# Patient Record
Sex: Female | Born: 1985 | Race: White | Hispanic: No | Marital: Married | Smoking: Never smoker
Health system: Southern US, Community
[De-identification: ages and names within clinical notes are randomized; demographics above are authoritative.]

## PROBLEM LIST (undated history)

## (undated) ENCOUNTER — Inpatient Hospital Stay (HOSPITAL_COMMUNITY): Payer: Self-pay

## (undated) DIAGNOSIS — F32A Depression, unspecified: Secondary | ICD-10-CM

## (undated) DIAGNOSIS — F329 Major depressive disorder, single episode, unspecified: Secondary | ICD-10-CM

## (undated) DIAGNOSIS — IMO0002 Reserved for concepts with insufficient information to code with codable children: Secondary | ICD-10-CM

## (undated) DIAGNOSIS — R87629 Unspecified abnormal cytological findings in specimens from vagina: Secondary | ICD-10-CM

## (undated) HISTORY — DX: Unspecified abnormal cytological findings in specimens from vagina: R87.629

## (undated) HISTORY — DX: Reserved for concepts with insufficient information to code with codable children: IMO0002

## (undated) HISTORY — DX: Depression, unspecified: F32.A

## (undated) HISTORY — PX: NO PAST SURGERIES: SHX2092

## (undated) HISTORY — DX: Major depressive disorder, single episode, unspecified: F32.9

---
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
## 2003-12-17 ENCOUNTER — Other Ambulatory Visit: Admission: RE | Admit: 2003-12-17 | Discharge: 2003-12-17 | Payer: Self-pay | Admitting: Obstetrics and Gynecology

## 2004-12-26 ENCOUNTER — Other Ambulatory Visit: Admission: RE | Admit: 2004-12-26 | Discharge: 2004-12-26 | Payer: Self-pay | Admitting: Obstetrics and Gynecology

## 2007-10-04 ENCOUNTER — Encounter: Admission: RE | Admit: 2007-10-04 | Discharge: 2007-10-04 | Payer: Self-pay | Admitting: Obstetrics and Gynecology

## 2012-04-22 LAB — OB RESULTS CONSOLE GC/CHLAMYDIA
Chlamydia: NEGATIVE
Gonorrhea: NEGATIVE

## 2012-04-22 LAB — OB RESULTS CONSOLE ANTIBODY SCREEN: Antibody Screen: NEGATIVE

## 2012-04-22 LAB — OB RESULTS CONSOLE GBS: GBS: POSITIVE

## 2012-04-22 LAB — OB RESULTS CONSOLE RPR: RPR: NONREACTIVE

## 2012-08-31 NOTE — L&D Delivery Note (Signed)
Delivery Note At 4:54 PM a viable and healthy female was delivered via Vaginal, Spontaneous Delivery (Presentation: Left Occiput Anterior).  APGAR: , ; weight .   Placenta status: spontaneous, intact .  Cord:  with the following complications: None.  Cord pH: na. Terminal meconium noted.  Anesthesia: Epidural  Episiotomy: None Lacerations: second degree Suture Repair: 2.0 vicryl rapide Est. Blood Loss (mL): 300  Mom to postpartum.  Baby to nursery-stable.  Fredric Slabach J 12/09/2012, 5:07 PM

## 2012-12-01 ENCOUNTER — Encounter (HOSPITAL_COMMUNITY): Payer: Self-pay | Admitting: *Deleted

## 2012-12-01 ENCOUNTER — Telehealth (HOSPITAL_COMMUNITY): Payer: Self-pay | Admitting: *Deleted

## 2012-12-01 NOTE — Telephone Encounter (Signed)
Preadmission screen  

## 2012-12-06 ENCOUNTER — Encounter (HOSPITAL_COMMUNITY): Payer: Self-pay | Admitting: *Deleted

## 2012-12-06 ENCOUNTER — Other Ambulatory Visit: Payer: Self-pay | Admitting: Obstetrics and Gynecology

## 2012-12-06 ENCOUNTER — Telehealth (HOSPITAL_COMMUNITY): Payer: Self-pay | Admitting: *Deleted

## 2012-12-06 NOTE — Telephone Encounter (Signed)
Preadmission screen  

## 2012-12-09 ENCOUNTER — Inpatient Hospital Stay (HOSPITAL_COMMUNITY)
Admission: AD | Admit: 2012-12-09 | Discharge: 2012-12-11 | DRG: 775 | Disposition: A | Discharge status: Home / Self Care | Payer: 59 | Source: Ambulatory Visit | Attending: Obstetrics and Gynecology | Admitting: Obstetrics and Gynecology

## 2012-12-09 ENCOUNTER — Inpatient Hospital Stay (HOSPITAL_COMMUNITY): Payer: 59 | Admitting: Anesthesiology

## 2012-12-09 ENCOUNTER — Encounter (HOSPITAL_COMMUNITY): Payer: Self-pay | Admitting: Anesthesiology

## 2012-12-09 DIAGNOSIS — D649 Anemia, unspecified: Secondary | ICD-10-CM | POA: Diagnosis not present

## 2012-12-09 DIAGNOSIS — O9903 Anemia complicating the puerperium: Secondary | ICD-10-CM | POA: Diagnosis not present

## 2012-12-09 LAB — CBC
MCH: 32.2 pg (ref 26.0–34.0)
MCHC: 34.9 g/dL (ref 30.0–36.0)
MCV: 92.4 fL (ref 78.0–100.0)
Platelets: 163 10*3/uL (ref 150–400)
RDW: 12.3 % (ref 11.5–15.5)

## 2012-12-09 LAB — RPR: RPR Ser Ql: NONREACTIVE

## 2012-12-09 MED ORDER — FLEET ENEMA 7-19 GM/118ML RE ENEM: 1.0000 | ENEMA | RECTAL | Status: DC | PRN

## 2012-12-09 MED ORDER — PRENATAL MULTIVITAMIN CH
1.0000 | ORAL_TABLET | Freq: Every day | ORAL | Status: DC
  Administered 2012-12-10: 1 via ORAL
  Filled 2012-12-09: qty 1

## 2012-12-09 MED ORDER — IBUPROFEN 600 MG PO TABS
600.0000 mg | ORAL_TABLET | Freq: Four times a day (QID) | ORAL | Status: DC
  Administered 2012-12-10 – 2012-12-11 (×6): 600 mg via ORAL
  Filled 2012-12-09 (×7): qty 1

## 2012-12-09 MED ORDER — OXYCODONE-ACETAMINOPHEN 5-325 MG PO TABS: 1.0000 | ORAL_TABLET | ORAL | Status: DC | PRN

## 2012-12-09 MED ORDER — PENICILLIN G POTASSIUM 5000000 UNITS IJ SOLR
5.0000 10*6.[IU] | Freq: Once | INTRAVENOUS | Status: AC
  Administered 2012-12-09: 5 10*6.[IU] via INTRAVENOUS
  Filled 2012-12-09: qty 5

## 2012-12-09 MED ORDER — ZOLPIDEM TARTRATE 5 MG PO TABS: 5.0000 mg | ORAL_TABLET | Freq: Every evening | ORAL | Status: DC | PRN

## 2012-12-09 MED ORDER — TETANUS-DIPHTH-ACELL PERTUSSIS 5-2.5-18.5 LF-MCG/0.5 IM SUSP
0.5000 mL | Freq: Once | INTRAMUSCULAR | Status: AC
  Administered 2012-12-10: 0.5 mL via INTRAMUSCULAR
  Filled 2012-12-09: qty 0.5

## 2012-12-09 MED ORDER — METHYLERGONOVINE MALEATE 0.2 MG PO TABS: 0.2000 mg | ORAL_TABLET | ORAL | Status: DC | PRN

## 2012-12-09 MED ORDER — PENICILLIN G POTASSIUM 5000000 UNITS IJ SOLR
2.5000 10*6.[IU] | INTRAVENOUS | Status: DC
  Administered 2012-12-09: 2.5 10*6.[IU] via INTRAVENOUS
  Filled 2012-12-09 (×4): qty 2.5

## 2012-12-09 MED ORDER — IBUPROFEN 600 MG PO TABS
600.0000 mg | ORAL_TABLET | Freq: Four times a day (QID) | ORAL | Status: DC | PRN
  Administered 2012-12-09: 600 mg via ORAL
  Filled 2012-12-09: qty 1

## 2012-12-09 MED ORDER — OXYTOCIN 40 UNITS IN LACTATED RINGERS INFUSION - SIMPLE MED
62.5000 mL/h | INTRAVENOUS | Status: DC
  Administered 2012-12-09: 62.5 mL/h via INTRAVENOUS
  Filled 2012-12-09: qty 1000

## 2012-12-09 MED ORDER — LIDOCAINE HCL (PF) 1 % IJ SOLN
30.0000 mL | INTRAMUSCULAR | Status: DC | PRN
  Filled 2012-12-09 (×2): qty 30

## 2012-12-09 MED ORDER — LACTATED RINGERS IV SOLN
500.0000 mL | Freq: Once | INTRAVENOUS | Status: AC
  Administered 2012-12-09: 500 mL via INTRAVENOUS

## 2012-12-09 MED ORDER — DIPHENHYDRAMINE HCL 50 MG/ML IJ SOLN: 12.5000 mg | INTRAMUSCULAR | Status: DC | PRN

## 2012-12-09 MED ORDER — LIDOCAINE HCL (PF) 1 % IJ SOLN
INTRAMUSCULAR | Status: DC | PRN
  Administered 2012-12-09 (×2): 4 mL

## 2012-12-09 MED ORDER — LANOLIN HYDROUS EX OINT: TOPICAL_OINTMENT | CUTANEOUS | Status: DC | PRN

## 2012-12-09 MED ORDER — FENTANYL 2.5 MCG/ML BUPIVACAINE 1/10 % EPIDURAL INFUSION (WH - ANES)
INTRAMUSCULAR | Status: DC | PRN
  Administered 2012-12-09: 14 mL/h via EPIDURAL

## 2012-12-09 MED ORDER — BENZOCAINE-MENTHOL 20-0.5 % EX AERO
1.0000 "application " | INHALATION_SPRAY | CUTANEOUS | Status: DC | PRN
  Administered 2012-12-09: 1 via TOPICAL
  Filled 2012-12-09: qty 56

## 2012-12-09 MED ORDER — DIPHENHYDRAMINE HCL 25 MG PO CAPS: 25.0000 mg | ORAL_CAPSULE | Freq: Four times a day (QID) | ORAL | Status: DC | PRN

## 2012-12-09 MED ORDER — EPHEDRINE 5 MG/ML INJ
10.0000 mg | INTRAVENOUS | Status: DC | PRN
  Filled 2012-12-09: qty 2

## 2012-12-09 MED ORDER — DIBUCAINE 1 % RE OINT: 1.0000 "application " | TOPICAL_OINTMENT | RECTAL | Status: DC | PRN

## 2012-12-09 MED ORDER — LACTATED RINGERS IV SOLN: 500.0000 mL | INTRAVENOUS | Status: DC | PRN

## 2012-12-09 MED ORDER — EPHEDRINE 5 MG/ML INJ
10.0000 mg | INTRAVENOUS | Status: DC | PRN
  Filled 2012-12-09: qty 2
  Filled 2012-12-09: qty 4

## 2012-12-09 MED ORDER — ONDANSETRON HCL 4 MG/2ML IJ SOLN: 4.0000 mg | Freq: Four times a day (QID) | INTRAMUSCULAR | Status: DC | PRN

## 2012-12-09 MED ORDER — PHENYLEPHRINE 40 MCG/ML (10ML) SYRINGE FOR IV PUSH (FOR BLOOD PRESSURE SUPPORT)
80.0000 ug | PREFILLED_SYRINGE | INTRAVENOUS | Status: DC | PRN
  Filled 2012-12-09: qty 2

## 2012-12-09 MED ORDER — FENTANYL 2.5 MCG/ML BUPIVACAINE 1/10 % EPIDURAL INFUSION (WH - ANES)
14.0000 mL/h | INTRAMUSCULAR | Status: DC | PRN
  Filled 2012-12-09: qty 125

## 2012-12-09 MED ORDER — PHENYLEPHRINE 40 MCG/ML (10ML) SYRINGE FOR IV PUSH (FOR BLOOD PRESSURE SUPPORT)
80.0000 ug | PREFILLED_SYRINGE | INTRAVENOUS | Status: DC | PRN
Start: 2012-12-09 — End: 2012-12-09
  Filled 2012-12-09: qty 5
  Filled 2012-12-09: qty 2

## 2012-12-09 MED ORDER — SENNOSIDES-DOCUSATE SODIUM 8.6-50 MG PO TABS
2.0000 | ORAL_TABLET | Freq: Every day | ORAL | Status: DC
  Administered 2012-12-09 – 2012-12-10 (×2): 2 via ORAL

## 2012-12-09 MED ORDER — SIMETHICONE 80 MG PO CHEW: 80.0000 mg | CHEWABLE_TABLET | ORAL | Status: DC | PRN

## 2012-12-09 MED ORDER — LACTATED RINGERS IV SOLN
INTRAVENOUS | Status: DC
  Administered 2012-12-09: 12:00:00 via INTRAVENOUS

## 2012-12-09 MED ORDER — ACETAMINOPHEN 325 MG PO TABS: 650.0000 mg | ORAL_TABLET | ORAL | Status: DC | PRN

## 2012-12-09 MED ORDER — FENTANYL 2.5 MCG/ML BUPIVACAINE 1/10 % EPIDURAL INFUSION (WH - ANES): 14.0000 mL/h | INTRAMUSCULAR | Status: DC | PRN

## 2012-12-09 MED ORDER — ONDANSETRON HCL 4 MG/2ML IJ SOLN: 4.0000 mg | INTRAMUSCULAR | Status: DC | PRN

## 2012-12-09 MED ORDER — TERBUTALINE SULFATE 1 MG/ML IJ SOLN: 0.2500 mg | Freq: Once | INTRAMUSCULAR | Status: DC | PRN

## 2012-12-09 MED ORDER — METHYLERGONOVINE MALEATE 0.2 MG/ML IJ SOLN: 0.2000 mg | INTRAMUSCULAR | Status: DC | PRN

## 2012-12-09 MED ORDER — OXYTOCIN BOLUS FROM INFUSION: 500.0000 mL | INTRAVENOUS | Status: DC

## 2012-12-09 MED ORDER — OXYTOCIN 40 UNITS IN LACTATED RINGERS INFUSION - SIMPLE MED
1.0000 m[IU]/min | INTRAVENOUS | Status: DC
  Administered 2012-12-09: 1 m[IU]/min via INTRAVENOUS

## 2012-12-09 MED ORDER — CITRIC ACID-SODIUM CITRATE 334-500 MG/5ML PO SOLN: 30.0000 mL | ORAL | Status: DC | PRN

## 2012-12-09 MED ORDER — WITCH HAZEL-GLYCERIN EX PADS: 1.0000 "application " | MEDICATED_PAD | CUTANEOUS | Status: DC | PRN

## 2012-12-09 MED ORDER — ONDANSETRON HCL 4 MG PO TABS: 4.0000 mg | ORAL_TABLET | ORAL | Status: DC | PRN

## 2012-12-09 NOTE — Progress Notes (Signed)
Delivery of live viable female by Dr Billy Coast, Melvyn Novas 9,9

## 2012-12-09 NOTE — Anesthesia Procedure Notes (Signed)
Epidural Patient location during procedure: OB Start time: 12/09/2012 12:41 PM  Staffing Anesthesiologist: Jahira Swiss A. Performed by: anesthesiologist   Preanesthetic Checklist Completed: patient identified, site marked, surgical consent, pre-op evaluation, timeout performed, IV checked, risks and benefits discussed and monitors and equipment checked  Epidural Patient position: sitting Prep: site prepped and draped and DuraPrep Patient monitoring: continuous pulse ox and blood pressure Approach: midline Injection technique: LOR air  Needle:  Needle type: Tuohy  Needle gauge: 17 G Needle length: 9 cm and 9 Needle insertion depth: 5 cm cm Catheter type: closed end flexible Catheter size: 19 Gauge Catheter at skin depth: 10 cm Test dose: negative and Other  Assessment Events: blood not aspirated, injection not painful, no injection resistance, negative IV test and no paresthesia  Additional Notes Patient identified. Risks and benefits discussed including failed block, incomplete  Pain control, post dural puncture headache, nerve damage, paralysis, blood pressure Changes, nausea, vomiting, reactions to medications-both toxic and allergic and post Partum back pain. All questions were answered. Patient expressed understanding and wished to proceed. Sterile technique was used throughout procedure. Epidural site was Dressed with sterile barrier dressing. No paresthesias, signs of intravascular injection Or signs of intrathecal spread were encountered.  Patient was more comfortable after the epidural was dosed. Please see RN's note for documentation of vital signs and FHR which are stable.

## 2012-12-09 NOTE — Anesthesia Preprocedure Evaluation (Signed)

## 2012-12-09 NOTE — Progress Notes (Signed)
[name] is a 27 y.o. G1P0 at [redacted]w[redacted]d by LMP admitted for rupture of membranes  Subjective: comfortable  Objective: LMP 02/26/2012      FHT:  FHR: 155 bpm, variability: moderate,  accelerations:  Present,  decelerations:  Absent UC:   irregular, every 53/90/0 minutes SVE:    3/90/0 Meconium srom  Labs: No results found for this basename: WBC, HGB, HCT, MCV, PLT    Assessment / Plan: Meconium SROM  Labor: Progressing normally Preeclampsia:  na Fetal Wellbeing:  Category I Pain Control:  Labor support without medications I/D:  n/a Anticipated MOD:  NSVD  [name] 12/09/2012, 11:19 AM

## 2012-12-10 ENCOUNTER — Inpatient Hospital Stay (HOSPITAL_COMMUNITY): Admission: RE | Admit: 2012-12-10 | Payer: 59 | Source: Ambulatory Visit

## 2012-12-10 LAB — CBC
Hemoglobin: 9.6 g/dL — ABNORMAL LOW (ref 12.0–15.0)
Platelets: 125 10*3/uL — ABNORMAL LOW (ref 150–400)
RBC: 2.98 MIL/uL — ABNORMAL LOW (ref 3.87–5.11)
WBC: 10.2 10*3/uL (ref 4.0–10.5)

## 2012-12-10 MED ORDER — FERROUS SULFATE 325 (65 FE) MG PO TABS
325.0000 mg | ORAL_TABLET | Freq: Every day | ORAL | Status: DC
  Administered 2012-12-10 – 2012-12-11 (×2): 325 mg via ORAL
  Filled 2012-12-10 (×2): qty 1

## 2012-12-10 NOTE — Anesthesia Postprocedure Evaluation (Signed)
  Anesthesia Post-op Note  Patient: [name]  Procedure(s) Performed: * No procedures listed *  Patient Location: PACU and Mother/Baby  Anesthesia Type:Epidural  Level of Consciousness: awake, alert  and oriented  Airway and Oxygen Therapy: Patient Spontanous Breathing  Post-op Pain: none  Post-op Assessment: Post-op Vital signs reviewed, Patient's Cardiovascular Status Stable, No headache, No backache, No residual numbness and No residual motor weakness  Post-op Vital Signs: Reviewed and stable  Complications: No apparent anesthesia complications

## 2012-12-10 NOTE — H&P (Signed)
[name], [name]             ACCOUNT NO.:  [id number]  MEDICAL RECORD NO.:  [id number]  LOCATION:  9125                          FACILITY:  [facility]  PHYSICIAN:  Lenoard Aden, M.D.DATE OF BIRTH:  [birth year]  DATE OF ADMISSION:  12/09/2012 DATE OF DISCHARGE:                             HISTORY & PHYSICAL   CHIEF COMPLAINT:  Spontaneous rupture of membranes with meconium at 41 weeks at group B Strep positive.  She is a 27 year old white female, G1, P0, at [redacted] weeks gestation with spontaneous rupture of membranes, meconium-stained fluid with GBS positive, favorable cervix for admission augmentation/induction.  She has no known drug allergies.  MEDICATIONS:  Prenatal vitamins.  SOCIAL HISTORY:  She is a nonsmoker, nondrinker.  She denies domestic or physical violence.  FAMILY HISTORY:  Spina bifida, hypertension, multiple sclerosis.  SURGICAL HISTORY:  Noncontributory.  MEDICAL HISTORY:  Otherwise noncontributory.  PHYSICAL EXAMINATION:  GENERAL:  She is well-developed well-nourished white female, in no acute distress. HEENT:  Normal. NECK:  Supple.  Full range of motion. LUNGS:  Clear. HEART:  Regular rhythm. ABDOMEN:  Soft, gravid, nontender.  Estimated fetal weight 7-7 pounds. Cervix is 3 cm, 90% vertex, 0 station. EXTREMITIES:  There are no cords. NEUROLOGIC:  Nonfocal. SKIN:  Intact.  IMPRESSION: 1. A 41-week intrauterine pregnancy. 2. GBS positive. 3. Meconium rupture of membranes.  PLAN:  Admit Pitocin and epidural as needed.  Anticipate attempts at vaginal delivery.     Lenoard Aden, M.D.     RJT/MEDQ  D:  12/09/2012  T:  12/10/2012  Job:  [phone]

## 2012-12-10 NOTE — Progress Notes (Signed)
Post Partum Day 1, s/p SVD, female. Breast feeding.  Subjective: no complaints, up ad lib, voiding and tolerating PO Had some postpartum bleeding last night but improved this morning.   Objective: Blood pressure 109/63, pulse 77, temperature 97.5 F (36.4 C), temperature source Oral, resp. rate 18, height 5\' 4"  (1.626 m), weight 160 lb (72.576 kg), last menstrual period 02/26/2012, unknown if currently breastfeeding.  Physical Exam:  General: alert and cooperative Lochia: appropriate Uterine Fundus: firm Incision: Perineal laceration, no complaints DVT Evaluation: No evidence of DVT seen on physical exam.   Recent Labs  12/09/12 1130 12/10/12 0720  HGB 12.7 9.6*  HCT 36.4 27.9*  Drop in H/H  Rub Imm TDaP not done in 3rd trim, plan to give before discharge, pt counseled, agrees.   Assessment/Plan: Plan for discharge tomorrow, Breastfeeding and Lactation consult Anemia- needs Iron daily and continue prenatal vitamin Baby is doing well.    LOS: 1 day   [name] R 12/10/2012, 9:49 PM

## 2012-12-11 ENCOUNTER — Inpatient Hospital Stay (HOSPITAL_COMMUNITY): Admission: RE | Admit: 2012-12-11 | Payer: 59 | Source: Ambulatory Visit

## 2012-12-11 MED ORDER — IBUPROFEN 600 MG PO TABS: 600.0000 mg | ORAL_TABLET | Freq: Four times a day (QID) | ORAL | Status: DC

## 2012-12-11 MED ORDER — FERROUS SULFATE 325 (65 FE) MG PO TABS: 325.0000 mg | ORAL_TABLET | Freq: Every day | ORAL | Status: DC

## 2012-12-11 NOTE — Discharge Summary (Signed)
Obstetric Discharge Summary Reason for Admission: onset of labor Prenatal Procedures: none Intrapartum Procedures: spontaneous vaginal delivery Postpartum Procedures: none Complications-Operative and Postpartum: 2nd degree perineal laceration  Hemoglobin  Date Value Range Status  12/10/2012 9.6* 12.0 - 15.0 g/dL Final     REPEATED TO VERIFY     DELTA CHECK NOTED     HCT  Date Value Range Status  12/10/2012 27.9* 36.0 - 46.0 % Final    Discharge Diagnoses: Term Pregnancy-delivered  Discharge Information: Date: 12/11/2012 Activity: pelvic rest Diet: routine Medications: Ibuprofen Condition: stable Instructions: refer to practice specific booklet Discharge to: home   Newborn Data: Live born female  Birth Weight: 7 lb (3175 g) APGAR: 9, 9  Home with mother.  [name] 12/11/2012, 10:41 AM

## 2012-12-11 NOTE — Clinical Social Work Note (Signed)
CSW spoke with [name].  [name] reports hx from early 20s, no current symptoms concerns.    Patient was referred for history of depression/anxiety.  * Referral screened out by Clinical Social Worker because none of the following criteria appear to apply: ~ History of anxiety/depression during this pregnancy, or of post-partum depression. ~ Diagnosis of anxiety and/or depression within last 3 years ~ History of depression due to pregnancy loss/loss of child  OR  * Patient's symptoms currently being treated with medication and/or therapy.  Please contact the Clinical Social Worker if needs arise, or by the patient's request.  [phone]

## 2012-12-11 NOTE — Progress Notes (Signed)
Post Partum Day#2,  s/p SVD, female. Breast feeding.  Subjective: no complaints, up ad lib, voiding and tolerating PO Had some postpartum bleeding last night but improved this morning.   Objective: BP 104/67  Pulse 67  Temp(Src) 98.5 F (36.9 C) (Oral)  Resp 18  Ht 5\' 4"  (1.626 m)  Wt 160 lb (72.576 kg)  BMI 27.45 kg/m2  LMP 02/26/2012  Physical Exam:  General: alert and cooperative Lochia: appropriate Uterine Fundus: firm Incision: Perineal laceration, no complaints DVT Evaluation: No evidence of DVT seen on physical exam.   Recent Labs  12/09/12 1130 12/10/12 0720  HGB 12.7 9.6*  HCT 36.4 27.9*  Drop in H/H , started Iron Rub Imm TDaP not done in 3rd trim, plan to give before discharge, pt counseled, agrees.   Assessment/Plan: Discharge home. Stable, Breast feeding. Baby girl doing well.  Anemia- needs Iron daily and continue prenatal vitamin F/up Dr Billy Coast 6 wks.  PPcare reviewed. Booklet from office provided.   [name] 12/11/2012, 10:37 AM

## 2014-07-02 ENCOUNTER — Encounter (HOSPITAL_COMMUNITY): Payer: Self-pay | Admitting: Anesthesiology

## 2018-06-05 ENCOUNTER — Ambulatory Visit (HOSPITAL_COMMUNITY)
Admission: AD | Admit: 2018-06-05 | Discharge: 2018-06-05 | Disposition: A | Discharge status: Home / Self Care | Payer: BLUE CROSS/BLUE SHIELD | Source: Ambulatory Visit | Attending: Obstetrics and Gynecology | Admitting: Obstetrics and Gynecology

## 2018-06-05 ENCOUNTER — Inpatient Hospital Stay (HOSPITAL_COMMUNITY): Payer: BLUE CROSS/BLUE SHIELD

## 2018-06-05 ENCOUNTER — Other Ambulatory Visit: Payer: Self-pay

## 2018-06-05 ENCOUNTER — Encounter (HOSPITAL_COMMUNITY)
Admission: AD | Disposition: A | Discharge status: Home / Self Care | Payer: Self-pay | Source: Ambulatory Visit | Attending: Obstetrics

## 2018-06-05 ENCOUNTER — Encounter (HOSPITAL_COMMUNITY): Payer: Self-pay

## 2018-06-05 ENCOUNTER — Inpatient Hospital Stay (HOSPITAL_COMMUNITY): Payer: BLUE CROSS/BLUE SHIELD | Admitting: Anesthesiology

## 2018-06-05 DIAGNOSIS — O00101 Right tubal pregnancy without intrauterine pregnancy: Secondary | ICD-10-CM | POA: Insufficient documentation

## 2018-06-05 DIAGNOSIS — O009 Unspecified ectopic pregnancy without intrauterine pregnancy: Secondary | ICD-10-CM | POA: Diagnosis present

## 2018-06-05 DIAGNOSIS — O209 Hemorrhage in early pregnancy, unspecified: Secondary | ICD-10-CM

## 2018-06-05 DIAGNOSIS — K661 Hemoperitoneum: Secondary | ICD-10-CM | POA: Insufficient documentation

## 2018-06-05 HISTORY — PX: LAPAROSCOPY: SHX197

## 2018-06-05 HISTORY — PX: LAPAROSCOPIC UNILATERAL SALPINGECTOMY: SHX5934

## 2018-06-05 LAB — DIC (DISSEMINATED INTRAVASCULAR COAGULATION) PANEL
APTT: 23 s — AB (ref 24–36)
D DIMER QUANT: 0.49 ug{FEU}/mL (ref 0.00–0.50)
INR: 1.1
PROTHROMBIN TIME: 14.1 s (ref 11.4–15.2)

## 2018-06-05 LAB — URINALYSIS, ROUTINE W REFLEX MICROSCOPIC
BILIRUBIN URINE: NEGATIVE
GLUCOSE, UA: NEGATIVE mg/dL
Ketones, ur: 5 mg/dL — AB
LEUKOCYTES UA: NEGATIVE
NITRITE: NEGATIVE
PH: 5 (ref 5.0–8.0)
Protein, ur: 30 mg/dL — AB
RBC / HPF: >50 RBC/hpf — ABNORMAL HIGH (ref 0–5)
Specific Gravity, Urine: 1.024 (ref 1.005–1.030)

## 2018-06-05 LAB — CBC
HCT: 35.1 % — ABNORMAL LOW (ref 36.0–46.0)
HEMATOCRIT: 31.5 % — AB (ref 36.0–46.0)
HEMATOCRIT: 26.5 % — AB (ref 36.0–46.0)
HEMOGLOBIN: 12.1 g/dL (ref 12.0–15.0)
Hemoglobin: 10.7 g/dL — ABNORMAL LOW (ref 12.0–15.0)
Hemoglobin: 9.5 g/dL — ABNORMAL LOW (ref 12.0–15.0)
MCH: 31 pg (ref 26.0–34.0)
MCH: 30.6 pg (ref 26.0–34.0)
MCH: 32.3 pg (ref 26.0–34.0)
MCHC: 34.5 g/dL (ref 30.0–36.0)
MCHC: 34 g/dL (ref 30.0–36.0)
MCHC: 35.8 g/dL (ref 30.0–36.0)
MCV: 90 fL (ref 78.0–100.0)
MCV: 90 fL (ref 78.0–100.0)
MCV: 90.1 fL (ref 78.0–100.0)
PLATELETS: 176 10*3/uL (ref 150–400)
Platelets: 178 10*3/uL (ref 150–400)
Platelets: 149 10*3/uL — ABNORMAL LOW (ref 150–400)
RBC: 3.9 MIL/uL (ref 3.87–5.11)
RBC: 3.5 MIL/uL — ABNORMAL LOW (ref 3.87–5.11)
RBC: 2.94 MIL/uL — AB (ref 3.87–5.11)
RDW: 12.2 % (ref 11.5–15.5)
RDW: 12.2 % (ref 11.5–15.5)
RDW: 12.2 % (ref 11.5–15.5)
WBC: 9.2 10*3/uL (ref 4.0–10.5)
WBC: 11.7 10*3/uL — ABNORMAL HIGH (ref 4.0–10.5)
WBC: 8.9 10*3/uL (ref 4.0–10.5)

## 2018-06-05 LAB — PREPARE RBC (CROSSMATCH)

## 2018-06-05 LAB — POCT PREGNANCY, URINE: PREG TEST UR: POSITIVE — AB

## 2018-06-05 LAB — WET PREP, GENITAL
Clue Cells Wet Prep HPF POC: NONE SEEN
SPERM: NONE SEEN
Trich, Wet Prep: NONE SEEN
Yeast Wet Prep HPF POC: NONE SEEN

## 2018-06-05 LAB — DIC (DISSEMINATED INTRAVASCULAR COAGULATION)PANEL
Fibrinogen: 260 mg/dL (ref 210–475)
Platelets: 184 10*3/uL (ref 150–400)
Smear Review: NONE SEEN

## 2018-06-05 LAB — HCG, QUANTITATIVE, PREGNANCY: HCG, BETA CHAIN, QUANT, S: 5650 m[IU]/mL — AB (ref <5)

## 2018-06-05 LAB — ABO/RH: ABO/RH(D): A POS

## 2018-06-05 SURGERY — LAPAROSCOPY OPERATIVE
Anesthesia: General | Site: Abdomen | Laterality: Right

## 2018-06-05 MED ORDER — HYDROMORPHONE HCL 1 MG/ML IJ SOLN
INTRAMUSCULAR | Status: AC
  Filled 2018-06-05: qty 0.5

## 2018-06-05 MED ORDER — FENTANYL CITRATE (PF) 100 MCG/2ML IJ SOLN
INTRAMUSCULAR | Status: DC | PRN
  Administered 2018-06-05 (×2): 50 ug via INTRAVENOUS
  Administered 2018-06-05: 100 ug via INTRAVENOUS

## 2018-06-05 MED ORDER — SODIUM CHLORIDE 0.9 % IV SOLN
INTRAVENOUS | Status: DC
  Administered 2018-06-05: 17:00:00 via INTRAVENOUS

## 2018-06-05 MED ORDER — FENTANYL CITRATE (PF) 250 MCG/5ML IJ SOLN
INTRAMUSCULAR | Status: AC
  Filled 2018-06-05: qty 5

## 2018-06-05 MED ORDER — SODIUM CHLORIDE 0.45 % IV SOLN
INTRAVENOUS | Status: DC | PRN
  Administered 2018-06-05 (×2): via INTRAVENOUS

## 2018-06-05 MED ORDER — ROCURONIUM BROMIDE 100 MG/10ML IV SOLN
INTRAVENOUS | Status: AC
  Filled 2018-06-05: qty 1

## 2018-06-05 MED ORDER — SODIUM CHLORIDE 0.9 % IJ SOLN
INTRAMUSCULAR | Status: AC
  Filled 2018-06-05: qty 20

## 2018-06-05 MED ORDER — ONDANSETRON HCL 4 MG/2ML IJ SOLN
INTRAMUSCULAR | Status: AC
  Filled 2018-06-05: qty 2

## 2018-06-05 MED ORDER — MIDAZOLAM HCL 2 MG/2ML IJ SOLN
INTRAMUSCULAR | Status: DC | PRN
  Administered 2018-06-05: 1 mg via INTRAVENOUS

## 2018-06-05 MED ORDER — LIDOCAINE HCL (CARDIAC) PF 100 MG/5ML IV SOSY
PREFILLED_SYRINGE | INTRAVENOUS | Status: DC | PRN
  Administered 2018-06-05: 100 mg via INTRAVENOUS

## 2018-06-05 MED ORDER — SODIUM CHLORIDE 0.9 % IV SOLN
510.0000 mg | Freq: Once | INTRAVENOUS | Status: AC
  Administered 2018-06-05: 510 mg via INTRAVENOUS
  Filled 2018-06-05: qty 17

## 2018-06-05 MED ORDER — ONDANSETRON HCL 4 MG PO TABS: 4.0000 mg | ORAL_TABLET | Freq: Four times a day (QID) | ORAL | Status: DC | PRN

## 2018-06-05 MED ORDER — LACTATED RINGERS IV SOLN
INTRAVENOUS | Status: DC | PRN
  Administered 2018-06-05: 15:00:00 via INTRAVENOUS

## 2018-06-05 MED ORDER — FAMOTIDINE IN NACL 20-0.9 MG/50ML-% IV SOLN
20.0000 mg | Freq: Once | INTRAVENOUS | Status: AC
  Administered 2018-06-05: 20 mg via INTRAVENOUS
  Filled 2018-06-05: qty 50

## 2018-06-05 MED ORDER — PANTOPRAZOLE SODIUM 40 MG PO TBEC
40.0000 mg | DELAYED_RELEASE_TABLET | Freq: Every day | ORAL | Status: DC
  Administered 2018-06-05: 40 mg via ORAL
  Filled 2018-06-05: qty 1

## 2018-06-05 MED ORDER — IBUPROFEN 600 MG PO TABS: 600.0000 mg | ORAL_TABLET | Freq: Four times a day (QID) | ORAL | 0 refills | Status: DC | PRN

## 2018-06-05 MED ORDER — BUPIVACAINE HCL (PF) 0.25 % IJ SOLN
INTRAMUSCULAR | Status: DC | PRN
  Administered 2018-06-05: 9 mL

## 2018-06-05 MED ORDER — SODIUM CHLORIDE 0.9% IV SOLUTION: Freq: Once | INTRAVENOUS | Status: DC

## 2018-06-05 MED ORDER — HYDROMORPHONE HCL 1 MG/ML IJ SOLN: 1.0000 mg | INTRAMUSCULAR | Status: DC | PRN

## 2018-06-05 MED ORDER — SUGAMMADEX SODIUM 200 MG/2ML IV SOLN
INTRAVENOUS | Status: DC | PRN
  Administered 2018-06-05: 200 mg via INTRAVENOUS

## 2018-06-05 MED ORDER — KETOROLAC TROMETHAMINE 30 MG/ML IJ SOLN
INTRAMUSCULAR | Status: AC
  Filled 2018-06-05: qty 1

## 2018-06-05 MED ORDER — SCOPOLAMINE 1 MG/3DAYS TD PT72
MEDICATED_PATCH | TRANSDERMAL | Status: DC | PRN
  Administered 2018-06-05: 1 via TRANSDERMAL

## 2018-06-05 MED ORDER — ONDANSETRON HCL 4 MG/2ML IJ SOLN: 4.0000 mg | Freq: Four times a day (QID) | INTRAMUSCULAR | Status: DC | PRN

## 2018-06-05 MED ORDER — ACETAMINOPHEN 10 MG/ML IV SOLN: 1000.0000 mg | Freq: Once | INTRAVENOUS | Status: DC | PRN

## 2018-06-05 MED ORDER — PROPOFOL 10 MG/ML IV BOLUS
INTRAVENOUS | Status: DC | PRN
  Administered 2018-06-05: 50 mg via INTRAVENOUS
  Administered 2018-06-05: 150 mg via INTRAVENOUS

## 2018-06-05 MED ORDER — SODIUM CHLORIDE 0.9 % IR SOLN
Status: DC | PRN
  Administered 2018-06-05: 3000 mL

## 2018-06-05 MED ORDER — KETOROLAC TROMETHAMINE 30 MG/ML IJ SOLN
INTRAMUSCULAR | Status: DC | PRN
  Administered 2018-06-05: 30 mg via INTRAVENOUS

## 2018-06-05 MED ORDER — OXYCODONE-ACETAMINOPHEN 5-325 MG PO TABS: 1.0000 | ORAL_TABLET | Freq: Four times a day (QID) | ORAL | 0 refills | Status: AC | PRN

## 2018-06-05 MED ORDER — MIDAZOLAM HCL 2 MG/2ML IJ SOLN
INTRAMUSCULAR | Status: AC
  Filled 2018-06-05: qty 2

## 2018-06-05 MED ORDER — PROPOFOL 10 MG/ML IV BOLUS
INTRAVENOUS | Status: AC
  Filled 2018-06-05: qty 20

## 2018-06-05 MED ORDER — HYDROCODONE-ACETAMINOPHEN 7.5-325 MG PO TABS
ORAL_TABLET | ORAL | Status: AC
  Filled 2018-06-05: qty 1

## 2018-06-05 MED ORDER — SIMETHICONE 80 MG PO CHEW: 80.0000 mg | CHEWABLE_TABLET | Freq: Four times a day (QID) | ORAL | Status: DC | PRN

## 2018-06-05 MED ORDER — MEPERIDINE HCL 25 MG/ML IJ SOLN: 6.2500 mg | INTRAMUSCULAR | Status: DC | PRN

## 2018-06-05 MED ORDER — IBUPROFEN 600 MG PO TABS
600.0000 mg | ORAL_TABLET | Freq: Four times a day (QID) | ORAL | Status: DC
  Administered 2018-06-05: 600 mg via ORAL
  Filled 2018-06-05: qty 1

## 2018-06-05 MED ORDER — DEXAMETHASONE SODIUM PHOSPHATE 10 MG/ML IJ SOLN
INTRAMUSCULAR | Status: DC | PRN
  Administered 2018-06-05: 10 mg via INTRAVENOUS

## 2018-06-05 MED ORDER — IBUPROFEN 600 MG PO TABS: 600.0000 mg | ORAL_TABLET | Freq: Four times a day (QID) | ORAL | Status: DC | PRN

## 2018-06-05 MED ORDER — LIDOCAINE HCL (CARDIAC) PF 100 MG/5ML IV SOSY
PREFILLED_SYRINGE | INTRAVENOUS | Status: AC
  Filled 2018-06-05: qty 5

## 2018-06-05 MED ORDER — SILVER NITRATE-POT NITRATE 75-25 % EX MISC
CUTANEOUS | Status: AC
  Filled 2018-06-05: qty 1

## 2018-06-05 MED ORDER — BUPIVACAINE HCL (PF) 0.25 % IJ SOLN
INTRAMUSCULAR | Status: AC
  Filled 2018-06-05: qty 30

## 2018-06-05 MED ORDER — PHENYLEPHRINE 40 MCG/ML (10ML) SYRINGE FOR IV PUSH (FOR BLOOD PRESSURE SUPPORT)
PREFILLED_SYRINGE | INTRAVENOUS | Status: AC
  Filled 2018-06-05: qty 10

## 2018-06-05 MED ORDER — SCOPOLAMINE 1 MG/3DAYS TD PT72
MEDICATED_PATCH | TRANSDERMAL | Status: AC
  Filled 2018-06-05: qty 1

## 2018-06-05 MED ORDER — PHENYLEPHRINE HCL 10 MG/ML IJ SOLN
INTRAMUSCULAR | Status: DC | PRN
  Administered 2018-06-05 (×3): 80 mg via INTRAVENOUS

## 2018-06-05 MED ORDER — ONDANSETRON HCL 4 MG/2ML IJ SOLN
INTRAMUSCULAR | Status: DC | PRN
  Administered 2018-06-05: 4 mg via INTRAVENOUS

## 2018-06-05 MED ORDER — MENTHOL 3 MG MT LOZG: 1.0000 | LOZENGE | OROMUCOSAL | Status: DC | PRN

## 2018-06-05 MED ORDER — OXYCODONE-ACETAMINOPHEN 5-325 MG PO TABS: 1.0000 | ORAL_TABLET | Freq: Four times a day (QID) | ORAL | 0 refills | Status: DC | PRN

## 2018-06-05 MED ORDER — SERTRALINE HCL 50 MG PO TABS
50.0000 mg | ORAL_TABLET | Freq: Every day | ORAL | Status: DC
  Administered 2018-06-05: 50 mg via ORAL
  Filled 2018-06-05 (×2): qty 1

## 2018-06-05 MED ORDER — PROMETHAZINE HCL 25 MG/ML IJ SOLN: 6.2500 mg | INTRAMUSCULAR | Status: DC | PRN

## 2018-06-05 MED ORDER — ONDANSETRON HCL 4 MG PO TABS: 4.0000 mg | ORAL_TABLET | Freq: Four times a day (QID) | ORAL | 0 refills | Status: DC | PRN

## 2018-06-05 MED ORDER — ROCURONIUM BROMIDE 100 MG/10ML IV SOLN
INTRAVENOUS | Status: DC | PRN
  Administered 2018-06-05: 35 mg via INTRAVENOUS
  Administered 2018-06-05: 5 mg via INTRAVENOUS

## 2018-06-05 MED ORDER — SUCCINYLCHOLINE CHLORIDE 20 MG/ML IJ SOLN
INTRAMUSCULAR | Status: DC | PRN
  Administered 2018-06-05: 120 mg via INTRAVENOUS

## 2018-06-05 MED ORDER — HYDROCODONE-ACETAMINOPHEN 7.5-325 MG PO TABS
1.0000 | ORAL_TABLET | Freq: Once | ORAL | Status: AC | PRN
  Administered 2018-06-05: 1 via ORAL

## 2018-06-05 MED ORDER — OXYCODONE-ACETAMINOPHEN 5-325 MG PO TABS
1.0000 | ORAL_TABLET | ORAL | Status: DC | PRN
  Administered 2018-06-05: 1 via ORAL
  Filled 2018-06-05: qty 1

## 2018-06-05 MED ORDER — SODIUM CHLORIDE 0.9 % IJ SOLN
INTRAMUSCULAR | Status: DC | PRN
  Administered 2018-06-05: 10 mL

## 2018-06-05 MED ORDER — HYDROMORPHONE HCL 1 MG/ML IJ SOLN
0.2500 mg | INTRAMUSCULAR | Status: DC | PRN
  Administered 2018-06-05 (×2): 0.25 mg via INTRAVENOUS

## 2018-06-05 MED ORDER — DEXAMETHASONE SODIUM PHOSPHATE 4 MG/ML IJ SOLN
INTRAMUSCULAR | Status: AC
  Filled 2018-06-05: qty 1

## 2018-06-05 SURGICAL SUPPLY — 35 items
ADH SKN CLS APL DERMABOND .7 (GAUZE/BANDAGES/DRESSINGS) ×2
BAG SPEC RTRVL LRG 6X4 10 (ENDOMECHANICALS) ×2
CABLE HIGH FREQUENCY MONO STRZ (ELECTRODE) IMPLANT
DECANTER SPIKE VIAL GLASS SM (MISCELLANEOUS) ×3 IMPLANT
DERMABOND ADVANCED (GAUZE/BANDAGES/DRESSINGS) ×1
DERMABOND ADVANCED .7 DNX12 (GAUZE/BANDAGES/DRESSINGS) IMPLANT
DRSG OPSITE POSTOP 3X4 (GAUZE/BANDAGES/DRESSINGS) ×3 IMPLANT
DURAPREP 26ML APPLICATOR (WOUND CARE) ×3 IMPLANT
ELECT REM PT RETURN 9FT ADLT (ELECTROSURGICAL) ×3
ELECTRODE REM PT RTRN 9FT ADLT (ELECTROSURGICAL) ×2 IMPLANT
FORCEPS CUTTING 33CM 5MM (CUTTING FORCEPS) ×1 IMPLANT
GLOVE BIO SURGEON STRL SZ 6.5 (GLOVE) ×3 IMPLANT
GLOVE BIOGEL PI IND STRL 7.0 (GLOVE) ×8 IMPLANT
GLOVE BIOGEL PI INDICATOR 7.0 (GLOVE) ×4
GOWN STRL REUS W/TWL LRG LVL3 (GOWN DISPOSABLE) ×6 IMPLANT
MANIPULATOR UTERINE 4.5 ZUMI (MISCELLANEOUS) ×3 IMPLANT
PACK LAPAROSCOPY BASIN (CUSTOM PROCEDURE TRAY) ×3 IMPLANT
PACK TRENDGUARD 450 HYBRID PRO (MISCELLANEOUS) IMPLANT
PENCIL BUTTON HOLSTER BLD 10FT (ELECTRODE) ×1 IMPLANT
POUCH SPECIMEN RETRIEVAL 10MM (ENDOMECHANICALS) ×1 IMPLANT
PROTECTOR NERVE ULNAR (MISCELLANEOUS) ×6 IMPLANT
SCISSORS LAP 5X35 DISP (ENDOMECHANICALS) IMPLANT
SET IRRIG TUBING LAPAROSCOPIC (IRRIGATION / IRRIGATOR) ×1 IMPLANT
SLEEVE XCEL OPT CAN 5 100 (ENDOMECHANICALS) ×3 IMPLANT
SOLUTION ELECTROLUBE (MISCELLANEOUS) ×1 IMPLANT
SUT VICRYL 0 UR6 27IN ABS (SUTURE) ×3 IMPLANT
SUT VICRYL 4-0 PS2 18IN ABS (SUTURE) ×3 IMPLANT
TOWEL OR 17X24 6PK STRL BLUE (TOWEL DISPOSABLE) ×6 IMPLANT
TRAY FOLEY W/BAG SLVR 14FR (SET/KITS/TRAYS/PACK) ×3 IMPLANT
TRENDGUARD 450 HYBRID PRO PACK (MISCELLANEOUS) ×3
TROCAR BALLN 12MMX100 BLUNT (TROCAR) ×3 IMPLANT
TROCAR XCEL NON-BLD 11X100MML (ENDOMECHANICALS) ×1 IMPLANT
TROCAR XCEL NON-BLD 5MMX100MML (ENDOMECHANICALS) ×3 IMPLANT
TUBING INSUF HEATED (TUBING) ×3 IMPLANT
WARMER LAPAROSCOPE (MISCELLANEOUS) ×3 IMPLANT

## 2018-06-05 NOTE — Progress Notes (Addendum)
Pt verbalized understanding and copy of instructions given to pt.   Pt d/c home escorted out by NT in wc with husband. Pt stable. Pt d/c home at 2130

## 2018-06-05 NOTE — Brief Op Note (Signed)
06/05/2018  3:35 PM  PATIENT:  [name]  32 y.o. female  PRE-OPERATIVE DIAGNOSIS:  ruptured ectopic  POST-OPERATIVE DIAGNOSIS:  ruptured ectopic  PROCEDURE:  Procedure(s): LAPAROSCOPY OPERATIVE WITH REMOVAL OF ECTOPIC PREGNANCY (N/A) LAPAROSCOPIC UNILATERAL SALPINGECTOMY (Right)  Evacuation of hemoperitoneum, removal of ectopic pregnancy  SURGEON:  Surgeon(s) and Role:    Noland Fordyce, MD - Primary  PHYSICIAN ASSISTANT:   ASSISTANTS: Renae Fickle, CNM   ANESTHESIA:   local and general  EBL:  675 mL   BLOOD ADMINISTERED:none  DRAINS: Urinary Catheter (Foley)   LOCAL MEDICATIONS USED:  MARCAINE    and Amount: 10 ml  SPECIMEN:  Source of Specimen:  right fallopian tube with ectopic pregnancy  DISPOSITION OF SPECIMEN:  PATHOLOGY  COUNTS:  YES  TOURNIQUET:  * No tourniquets in log *  DICTATION: .Note written in EPIC  PLAN OF CARE: extended observation  PATIENT DISPOSITION:  PACU - hemodynamically stable.   Delay start of Pharmacological VTE agent (>24hrs) due to surgical blood loss or risk of bleeding: yes

## 2018-06-05 NOTE — Anesthesia Preprocedure Evaluation (Signed)
Anesthesia Evaluation  Patient identified by MRN, date of birth, ID band Patient awake    Reviewed: Allergy & Precautions, H&P , Patient's Chart, lab work & pertinent test results  Airway Mallampati: II  TM Distance: >3 FB Neck ROM: Full    Dental no notable dental hx. (+) Teeth Intact, Dental Advisory Given   Pulmonary neg pulmonary ROS,    Pulmonary exam normal breath sounds clear to auscultation       Cardiovascular Exercise Tolerance: Good negative cardio ROS Normal cardiovascular exam Rhythm:Regular Rate:Normal     Neuro/Psych PSYCHIATRIC DISORDERS negative neurological ROS     GI/Hepatic negative GI ROS, Neg liver ROS,   Endo/Other    Renal/GU      Musculoskeletal   Abdominal   Peds  Hematology   Anesthesia Other Findings   Reproductive/Obstetrics (+) Pregnancy                             Lab Results  Component Value Date   WBC 9.2 06/05/2018   HGB 12.1 06/05/2018   HCT 35.1 (L) 06/05/2018   MCV 90.0 06/05/2018   PLT 178 06/05/2018    Anesthesia Physical Anesthesia Plan  ASA: II and emergent  Anesthesia Plan: General   Post-op Pain Management:    Induction: Rapid sequence, Intravenous and Cricoid pressure planned  PONV Risk Score and Plan: 4 or greater and Treatment may vary due to age or medical condition, Ondansetron, Dexamethasone and Scopolamine patch - Pre-op  Airway Management Planned: Oral ETT  Additional Equipment:   Intra-op Plan:   Post-operative Plan: Extubation in OR  Informed Consent: I have reviewed the patients History and Physical, chart, labs and discussed the procedure including the risks, benefits and alternatives for the proposed anesthesia with the patient or authorized representative who has indicated his/her understanding and acceptance.   Dental advisory given  Plan Discussed with: CRNA  Anesthesia Plan Comments:          Anesthesia Quick Evaluation

## 2018-06-05 NOTE — MAU Provider Note (Signed)
Chief Complaint: Vaginal Bleeding; Abdominal Pain; and Passed out   First Provider Initiated Contact with Patient 06/05/18 1120        SUBJECTIVE HPI: Colleen COLLEDGE is a 32 y.o. G1P1001 at Unknown by LMP who presents to maternity admissions reporting vaginal bleeding.  Was initially seen with a positive pregnancy test last week in the office.  Her first HCG was 3000 on 06/02/18 per her report.  Plan was to repeat HCG tomorrow.  Called this morning with continued bleeding and was told to come in. She denies vaginal itching/burning, urinary symptoms, h/a, dizziness, n/v, or fever/chills.    Vaginal Bleeding  The patient's primary symptoms include pelvic pain and vaginal bleeding. The patient's pertinent negatives include no genital itching, genital lesions or genital odor. This is a new problem. The current episode started in the past 7 days. The problem has been gradually worsening. The pain is mild. The problem affects both sides. She is pregnant. Associated symptoms include abdominal pain. Pertinent negatives include no back pain, chills, constipation, diarrhea or fever. The vaginal discharge was bloody. The vaginal bleeding is lighter than menses. She has not been passing clots. She has not been passing tissue. Nothing aggravates the symptoms. She has tried nothing for the symptoms.  Abdominal Pain  This is a new problem. The current episode started today. The onset quality is gradual. The problem occurs intermittently. The problem has been unchanged. The quality of the pain is cramping. The abdominal pain does not radiate. Pertinent negatives include no constipation, diarrhea or fever.   RN Note: + HPT last week and had blood work in the office Intermittent bleeding for a week, no heavy Had some pain this morning and then blacked out and felt hot and sweaty  Past Medical History:  Diagnosis Date  . Abnormal Pap smear   . Depression    Past Surgical History:  Procedure Laterality Date   . NO PAST SURGERIES     Social History   Socioeconomic History  . Marital status: Married    Spouse name: Not on file  . Number of children: Not on file  . Years of education: Not on file  . Highest education level: Not on file  Occupational History  . Not on file  Social Needs  . Financial resource strain: Not on file  . Food insecurity:    Worry: Not on file    Inability: Not on file  . Transportation needs:    Medical: Not on file    Non-medical: Not on file  Tobacco Use  . Smoking status: Never Smoker  . Smokeless tobacco: Never Used  Substance and Sexual Activity  . Alcohol use: No  . Drug use: No  . Sexual activity: Yes  Lifestyle  . Physical activity:    Days per week: Not on file    Minutes per session: Not on file  . Stress: Not on file  Relationships  . Social connections:    Talks on phone: Not on file    Gets together: Not on file    Attends religious service: Not on file    Active member of club or organization: Not on file    Attends meetings of clubs or organizations: Not on file    Relationship status: Not on file  . Intimate partner violence:    Fear of current or ex partner: Not on file    Emotionally abused: Not on file    Physically abused: Not on file  Forced sexual activity: Not on file  Other Topics Concern  . Not on file  Social History Narrative  . Not on file   No current facility-administered medications on file prior to encounter.    Current Outpatient Medications on File Prior to Encounter  Medication Sig Dispense Refill  . ferrous sulfate 325 (65 FE) MG tablet Take 1 tablet (325 mg total) by mouth daily with breakfast. 90 tablet 2  . ibuprofen (ADVIL,MOTRIN) 600 MG tablet Take 1 tablet (600 mg total) by mouth every 6 (six) hours. 30 tablet 0  . Prenatal Vit-Fe Fumarate-FA (PRENATAL MULTIVITAMIN) TABS Take 1 tablet by mouth daily at 12 noon.     No Known Allergies  I have reviewed patient's Past Medical Hx, Surgical Hx,  Family Hx, Social Hx, medications and allergies.   ROS:  Review of Systems  Constitutional: Negative for chills and fever.  Gastrointestinal: Positive for abdominal pain. Negative for constipation and diarrhea.  Genitourinary: Positive for pelvic pain and vaginal bleeding.  Musculoskeletal: Negative for back pain.   Review of Systems  Other systems negative   Physical Exam  Physical Exam Patient Vitals for the past 24 hrs:  BP Temp Temp src Pulse Resp Weight  06/05/18 1112 100/60 (!) 97.5 F (36.4 C) Oral 92 16 64.8 kg   Constitutional: Well-developed, well-nourished female in no acute distress.  Cardiovascular: normal rate Respiratory: normal effort GI: Abd soft, non-tender. Pos BS x 4 MS: Extremities nontender, no edema, normal ROM Neurologic: Alert and oriented x 4.  GU: Neg CVAT.  PELVIC EXAM: Cervix pink, visually closed, without lesion, small amount of bloody discharge, vaginal walls and external genitalia normal Bimanual exam: Cervix 0/long/high, firm, anterior, neg CMT, uterus nontender, nonenlarged, adnexa without tenderness, enlargement, or mass   LAB RESULTS Results for orders placed or performed during the hospital encounter of 06/05/18 (from the past 24 hour(s))  hCG, quantitative, pregnancy     Status: Abnormal   Collection Time: 06/05/18 11:29 AM  Result Value Ref Range   hCG, Beta Chain, Quant, S 5,650 (H) <5 mIU/mL  CBC     Status: Abnormal   Collection Time: 06/05/18 11:29 AM  Result Value Ref Range   WBC 9.2 4.0 - 10.5 K/uL   RBC 3.90 3.87 - 5.11 MIL/uL   Hemoglobin 12.1 12.0 - 15.0 g/dL   HCT 65.7 (L) 84.6 - 96.2 %   MCV 90.0 78.0 - 100.0 fL   MCH 31.0 26.0 - 34.0 pg   MCHC 34.5 30.0 - 36.0 g/dL   RDW 95.2 84.1 - 32.4 %   Platelets 178 150 - 400 K/uL  Wet prep, genital     Status: Abnormal   Collection Time: 06/05/18 11:49 AM  Result Value Ref Range   Yeast Wet Prep HPF POC NONE SEEN NONE SEEN   Trich, Wet Prep NONE SEEN NONE SEEN   Clue  Cells Wet Prep HPF POC NONE SEEN NONE SEEN   WBC, Wet Prep HPF POC FEW (A) NONE SEEN   Sperm NONE SEEN   Urinalysis, Routine w reflex microscopic     Status: Abnormal   Collection Time: 06/05/18 11:50 AM  Result Value Ref Range   Color, Urine YELLOW YELLOW   APPearance CLEAR CLEAR   Specific Gravity, Urine 1.024 1.005 - 1.030   pH 5.0 5.0 - 8.0   Glucose, UA NEGATIVE NEGATIVE mg/dL   Hgb urine dipstick LARGE (A) NEGATIVE   Bilirubin Urine NEGATIVE NEGATIVE   Ketones, ur 5 (A) NEGATIVE  mg/dL   Protein, ur 30 (A) NEGATIVE mg/dL   Nitrite NEGATIVE NEGATIVE   Leukocytes, UA NEGATIVE NEGATIVE   RBC / HPF >50 (H) 0 - 5 RBC/hpf   WBC, UA 0-5 0 - 5 WBC/hpf   Bacteria, UA RARE (A) NONE SEEN   Squamous Epithelial / LPF 0-5 0 - 5   Mucus PRESENT   Pregnancy, urine POC     Status: Abnormal   Collection Time: 06/05/18 11:51 AM  Result Value Ref Range   Preg Test, Ur POSITIVE (A) NEGATIVE    IMAGING US Ob Less Than 14 Weeks With Ob Transvaginal  Result Date: 06/05/2018 CLINICAL DATA:  32 year old female with vaginal bleeding in the 1st trimester of pregnancy. Orthostatic. Quantitative beta HCG 5,650. EXAM: OBSTETRIC <14 WK [place] AND TRANSVAGINAL OB US TECHNIQUE: Both transabdominal and transvaginal ultrasound examinations were performed for complete evaluation of the gestation as well as the maternal uterus, adnexal regions, and pelvic cul-de-sac. Transvaginal technique was performed to assess early pregnancy. COMPARISON:  None. FINDINGS: Intrauterine gestational sac: None, the endometrium appears bland. Yolk sac:  None. Embryo:  None. Cardiac Activity: Not applicable Subchorionic hemorrhage:  Not applicable Maternal uterus/adnexae: The left ovary appears normal measuring 2.3 x 1.5 x 1.6 centimeters. The right ovary is larger. The right ovary contains both a 2.1 centimeter partially cystic partially complex lesion with peripheral vascularity (image 21), and a smaller oval 7 millimeter cystic structure  (image 25) situated in a much larger area of complex echogenicity (up to 6 centimeters, image 26). Furthermore, there is a large volume of free fluid in the pelvis and lower quadrants with low level internal echoes (image 32). IMPRESSION: Constellation of findings highly suspicious for Right Adnexal Ectopic Pregnancy with Rupture and Hemoperitoneum. -no IUP, bland endometrium. -large volume free fluid with internal echo suspicious for blood. -complex right ovarian area up to 6 centimeters with a small 7 mm oval structure suspicious for gestational sac. Critical Value/emergent results were called by telephone at the time of interpretation on 06/05/2018 at 1:14 pm to [facility] , who verbally acknowledged these results. Electronically Signed   By: Odessa Fleming M.D.   On: 06/05/2018 13:16    MAU Management/MDM: Ordered usual first trimester r/o ectopic labs.   Pelvic exam and cultures done Will check baseline Ultrasound to rule out ectopic.  In Ultrasound, patient became presyncopal and was brought back to MAU She was pale and felt faint.  I had them do orthostatic VS which were positive Vitals:   06/05/18 1304 06/05/18 1305 06/05/18 1309 06/05/18 1326  BP: (!) 105/57 (!) 84/55 (!) 71/48 121/77  Pulse: 91 (!) 101 (!) 116 99  Resp:      Temp:      TempSrc:      Weight:       Consult Dr Erin Fulling and Dr Ernestina Penna with presentation, exam findings, and results.   Crossmatched 2 units of blood and starte large bore IV with NS infusion.  Dr Ernestina Penna arrived and is preparing her for surgery Anesthesia notified  This bleeding/pain can represent a normal pregnancy with bleeding, spontaneous abortion or even an ectopic which can be life-threatening.  The process as listed above helps to determine which of these is present.   ASSESSMENT Pregnancy at [redacted]w[redacted]d Ectopic pregnancy with two focuses on right adnexa Free fluid in abdomen, presumed to be blood  PLAN Admit for surgery DIC and repeat  CBC ordered MD to follow  Wynelle Bourgeois CNM, MSN Certified Nurse-Midwife 06/05/2018  11:20 AM

## 2018-06-05 NOTE — Transfer of Care (Signed)
Immediate Anesthesia Transfer of Care Note  Patient: [name]  Procedure(s) Performed: LAPAROSCOPY OPERATIVE WITH REMOVAL OF ECTOPIC PREGNANCY (N/A Abdomen) LAPAROSCOPIC UNILATERAL SALPINGECTOMY (Right Abdomen)  Patient Location: PACU  Anesthesia Type:General  Level of Consciousness: sedated  Airway & Oxygen Therapy: Patient Spontanous Breathing and Patient connected to nasal cannula oxygen  Post-op Assessment: Report given to RN  Post vital signs: Reviewed and stable  Last Vitals:  Vitals Value Taken Time  BP    Temp    Pulse 91 06/05/2018  3:41 PM  Resp 17 06/05/2018  3:41 PM  SpO2 100 % 06/05/2018  3:41 PM  Vitals shown include unvalidated device data.  Last Pain:  Vitals:   06/05/18 1112  TempSrc: Oral  PainSc:          Complications: No apparent anesthesia complications

## 2018-06-05 NOTE — Discharge Instructions (Signed)
Ruptured Ectopic Pregnancy °An ectopic pregnancy is when the fertilized egg attaches (implants) outside the uterus. Most ectopic pregnancies occur in the fallopian tube. Rarely do ectopic pregnancies occur on the ovary, intestine, pelvis, or cervix. An ectopic pregnancy does not have the ability to develop into a normal, healthy baby. °A ruptured ectopic pregnancy is one in which the fallopian tube gets torn or bursts and results in internal bleeding. Often there is intense abdominal pain, and sometimes, vaginal bleeding. Having an ectopic pregnancy can be a life-threatening experience. If left untreated, this dangerous condition can lead to a blood transfusion, abdominal surgery, or even death. °What are the causes? °Damage to the fallopian tubes is the suspected cause in most ectopic pregnancies. °What increases the risk? °Depending on your circumstances, the amount of risk of having an ectopic pregnancy will vary. There are 3 categories that may help you identify whether you are potentially at risk. °High Risk °· You have gone through infertility treatment. °· You have had a previous ectopic pregnancy. °· You have had previous tubal surgery. °· You have had previous surgery to have the fallopian tubes tied (tubal ligation). °· You have tubal problems or diseases. °· You have been exposed to DES. DES is a medicine that was used until 1971 and had effects on babies whose mothers took the medicine. °· You become pregnant while using an intrauterine device (IUD) for birth control. ° °Moderate Risk °· You have a history of infertility. °· You have a history of a sexually transmitted infection (STI). °· You have a history of pelvic inflammatory disease (PID). °· You have scarring from endometriosis. °· You have multiple sexual partners. °· You smoke. ° °Low Risk °· You have had previous pelvic surgery. °· You use vaginal douching. °· You became sexually active before 34 years of age. ° °What are the signs or  symptoms? °An ectopic pregnancy should be suspected in anyone who has missed a period and has abdominal pain or bleeding. °· You may experience normal pregnancy symptoms, such as: °? Nausea. °? Tiredness. °? Breast tenderness. °· Symptoms that are not normal include: °? Pain with intercourse. °? Irregular vaginal bleeding or spotting. °? Cramping or pain on one side, or in the lower abdomen. °? Fast heartbeat. °? Passing out while having a bowel movement. °· Symptoms of a ruptured ectopic pregnancy and internal bleeding may include: °? Sudden, severe pain in the abdomen and pelvis. °? Dizziness or fainting. °? Pain in the shoulder area. ° °How is this diagnosed? °Tests that may be performed include: °· A pregnancy test. °· An ultrasound. °· Testing the specific level of pregnancy hormone in the bloodstream. °· Taking a sample of uterus tissue (dilation and curettage, D&C). °· Surgery to perform a visual exam of the inside of the abdomen using a lighted tube (laparoscopy). ° °How is this treated? °Laparoscopic surgery or abdominal surgery is recommended for a ruptured ectopic pregnancy. °· The whole fallopian tube may need to be removed (salpingectomy). °· If the tube is not too damaged, the tube may be saved, and the pregnancy will be surgically removed. In time, the tube may still function. °· If you have lost a lot of blood, you may need a blood transfusion. °· You may receive a Rho (D) immune globulin shot if you are Rh negative and the father is Rh positive, or if you do not know the Rh type of the father. This is to prevent problems with any future pregnancy. ° °Get help right   away if: °You have any symptoms of an ectopic or ruptured ectopic pregnancy. This is a medical emergency. °This information is not intended to replace advice given to you by your health care provider. Make sure you discuss any questions you have with your health care provider. °Document Released: 08/14/2000 Document Revised: 03/06/2016  Document Reviewed: 05/29/2013 °Elsevier Interactive Patient Education © 2018 [employer] ° °

## 2018-06-05 NOTE — Anesthesia Procedure Notes (Signed)
Procedure Name: Intubation Date/Time: 06/05/2018 2:11 PM Performed by: Asher Muir, CRNA Pre-anesthesia Checklist: Patient identified, Emergency Drugs available, Suction available and Patient being monitored Patient Re-evaluated:Patient Re-evaluated prior to induction Oxygen Delivery Method: Circle system utilized Preoxygenation: Pre-oxygenation with 100% oxygen Induction Type: IV induction and Cricoid Pressure applied Ventilation: Mask ventilation without difficulty Laryngoscope Size: Mac and 3 Grade View: Grade III Tube type: Oral Tube size: 7.0 mm Number of attempts: 1 Placement Confirmation: ETT inserted through vocal cords under direct vision,  positive ETCO2 and breath sounds checked- equal and bilateral Secured at: 20 cm Tube secured with: Tape Dental Injury: Teeth and Oropharynx as per pre-operative assessment

## 2018-06-05 NOTE — MAU Note (Signed)
+   HPT last week and had blood work in the office  Intermittent bleeding for a week, no heavy  Had some pain this morning and then blacked out and felt hot and sweaty

## 2018-06-05 NOTE — Op Note (Signed)
06/05/2018  3:35 PM  PATIENT:  [name]  32 y.o. female  PRE-OPERATIVE DIAGNOSIS:  ruptured ectopic  POST-OPERATIVE DIAGNOSIS:  ruptured ectopic  PROCEDURE:  Procedure(s): LAPAROSCOPY OPERATIVE WITH REMOVAL OF ECTOPIC PREGNANCY (N/A) LAPAROSCOPIC UNILATERAL SALPINGECTOMY (Right)  Evacuation of hemoperitoneum, removal of ectopic pregnancy  SURGEON:  Surgeon(s) and Role:    Noland Fordyce, MD - Primary  PHYSICIAN ASSISTANT:   ASSISTANTS: Renae Fickle, CNM   ANESTHESIA:   local and general  EBL:  675 mL   BLOOD ADMINISTERED:none  DRAINS: Urinary Catheter (Foley)   LOCAL MEDICATIONS USED:  MARCAINE    and Amount: 10 ml  SPECIMEN:  Source of Specimen:  right fallopian tube with ectopic pregnancy  DISPOSITION OF SPECIMEN:  PATHOLOGY  COUNTS:  YES  TOURNIQUET:  * No tourniquets in log *  DICTATION: .Note written in EPIC  PLAN OF CARE: extended observation  PATIENT DISPOSITION:  PACU - hemodynamically stable.   Delay start of Pharmacological VTE agent (>24hrs) due to surgical blood loss or risk of bleeding: yes   Abx: none  Estimated blood loss: 675 hemoperitoneum, no additional blood loss Complications: None Antibiotics: none  Findings: Large volume hemoperitoneum, Distended right fallopian tube with ectopic and rupture. Endometriosis in right ovarian fossa, distal L tubal cyst. Blunted Left fimbria.  Indications: This is a 32 yo G2P1 with hemodynamic instability; abdominal, rectal and right shoulder pain and u/s finding of ectopic pregnancy with large hemoperitoneum.  Procedure: Findings d/w pt, surgery with laparoscopic salpingectomy recommended. R./B reviewed with pt who agreed to proceed.   Patient was prepped and draped in the normal sterile fashion in dorsal supine lithotomy position. A bimanual examination was done to assess the size and position of the uterus. A sterile speculum was placed in the vagina and a single-tooth tenaculum used to grasp the  anterior lip of the cervix. The cervix was serially dilated to a #21 Shawnie Pons. With traction on the tenaculum that was placed at 12:00 at the cervix, a ZUMI uterine manipulator was advanced through the cervical os and into the uterus. However, Zumi would not stay in place and an acorn manipulator was placed into the cervix and attached to the tenaculum. The speculum was then removed.  Gloves were changed attention was turned to the patient's abdomen. 10 cc of 1/4% Marcaine were used in the infraumbilical fold and in the left and right lower quadrants. A 10 mm skin incision was made in the infraumbilical fold and a Verees needle was inserted sharply into the peritoneal cavity. A saline filled syringe was used to confirm intraperitoneal placement. The peritoneum was then insufflated with CO2 gas to 15 mm of mercury. An 0   non-bladed trocar was then inserted into the peritoneal cavity. Proper placement was confirmed by the laparoscope. The pelvis was fully evaluated with findings as above. Right and left lower quadrant sites were marked, local anesthetic was infused, and 5 mm skin incisions were made in the right left lower cord. Non-bladed 5 mm trochars were then inserted under direct visualization.  Grasping instruments were placed through the lower ports to aid in visualization of the patient's abdomen and pelvis. Suction irrigation was carried out in order to see pelvic organs due to the large hemoperitoneum. The decision was then made to proceed with righ salpingectomy. The gyrus device was used to free the tube from the right ovary. The mesosalpynx was then serially transected, just under the fallopian tube and ectopic pregnancy. Starting at the fimbriated end. Cautery and  transection were done medially.  With careful attention to the right  infundibulopelvic ligament I was sure to stay in the mesosalpinx. At the cornual edge, I cauterized and transected the tube. Once free and hemostasis was assured, I  switched to a 5 mm camera placed a Endo Catch bag through the umbilicus port. The removed tube with ectopic pregnancy was placed in the Endo Catch bag and removed through the umbilical port. The pelvis was then copiously irrigated with warmed normal saline until all blood and clot was removed. Of note, due to the voume of blood and clot, a 10mm suction irrigator was needed.  Patient was reversed from her Trendelenburg position to allow any blood from the upper abdomen to fall into the cul-de-sac. Additional suction irrigation was done in the right upper quadrant. All blood and clot were removed. The pedicle was reinspected and found to be hemostatic.  The trochars were removed under direct visualization. Pneumoperitoneum was released. The skin incisions were closed with 4-0 Vicryl and Dermabond was placed on top of them after ensuring hemostasis.  The ZUMI uterine manipulator was removed. The vagina was hemostatic.  Sponge lap and needle counts were correct x3. Patient was woken from general anesthesia having tolerated the procedure well.

## 2018-06-05 NOTE — H&P (Signed)
CC: nausea, abd pain  HPIL 31 yo G2P1001 (prior uncomplicated FT delivery) with new pregnancy presents with nausea and abdominal pain. Pt with beta in office last wk of 3k but has not yet started PN care. Presents today with above sx.   Just after pelvic u/s pt with dizziness and pre-syncope  Meds: none NKDA PMH: none PSH: none Pgyn Hx: no h/o GC/CT/PID, no h/o infertilty, no prior dx of endometriosis  PE:   Vitals:   06/05/18 1309 06/05/18 1326  BP: (!) 71/48 121/77  Pulse: (!) 116 99  Resp:    Temp:     Gen: uncomfortable, no distess CV: tachy Abd: tender, rebound GU: def    CBC    Component Value Date/Time   WBC 11.7 (H) 06/05/2018 1343   RBC 3.50 (L) 06/05/2018 1343   HGB 10.7 (L) 06/05/2018 1343   HCT 31.5 (L) 06/05/2018 1343   PLT 176 06/05/2018 1343   MCV 90.0 06/05/2018 1343   MCH 30.6 06/05/2018 1343   MCHC 34.0 06/05/2018 1343   RDW 12.2 06/05/2018 1343    U/S: marked complex fluid. R ectopic, thin EMS  A/P: ruptured right ectopic pregnancy with hemodynamic instability and marked hemoperitoneum.  Recc urgent lap salpingectomy with removal of hemoperitoneum and ectopic preg. Pt aware risk of open surgery, infection, blood tx, DIC, oophorectomy, fertility complications in future. Pt consents to surgery.  Repeat CBC and add DIC panel. Tx as needed.  Lendon Colonel 06/05/2018 1:56 PM

## 2018-06-06 ENCOUNTER — Encounter (HOSPITAL_COMMUNITY): Payer: Self-pay | Admitting: Obstetrics

## 2018-06-06 LAB — HIV ANTIBODY (ROUTINE TESTING W REFLEX): HIV Screen 4th Generation wRfx: NONREACTIVE

## 2018-06-06 LAB — GC/CHLAMYDIA PROBE AMP (~~LOC~~) NOT AT ARMC
CHLAMYDIA, DNA PROBE: NEGATIVE
NEISSERIA GONORRHEA: NEGATIVE

## 2018-06-06 NOTE — Anesthesia Postprocedure Evaluation (Signed)
Anesthesia Post Note  Patient: [name]  Procedure(s) Performed: LAPAROSCOPY OPERATIVE WITH REMOVAL OF ECTOPIC PREGNANCY (N/A Abdomen) LAPAROSCOPIC UNILATERAL SALPINGECTOMY (Right Abdomen)     Patient location during evaluation: PACU Anesthesia Type: General Level of consciousness: awake and alert Pain management: pain level controlled Vital Signs Assessment: post-procedure vital signs reviewed and stable Respiratory status: spontaneous breathing, nonlabored ventilation, respiratory function stable and patient connected to nasal cannula oxygen Cardiovascular status: blood pressure returned to baseline and stable Postop Assessment: no apparent nausea or vomiting Anesthetic complications: no    Last Vitals:  Vitals:   06/05/18 1955 06/05/18 2045  BP: 96/61 (!) 100/54  Pulse: 77 77  Resp: 17 17  Temp: 36.9 C 36.9 C  SpO2: 100%     Last Pain:  Vitals:   06/05/18 2045  TempSrc: Oral  PainSc:    Pain Goal:                 Trevor Iha

## 2018-06-09 LAB — TYPE AND SCREEN
ABO/RH(D): A POS
ANTIBODY SCREEN: NEGATIVE
UNIT DIVISION: 0
Unit division: 0
Unit division: 0
Unit division: 0

## 2018-06-09 LAB — BPAM RBC
Blood Product Expiration Date: 201910192359
Blood Product Expiration Date: 201910202359
Blood Product Expiration Date: 201911022359
Blood Product Expiration Date: 201911022359
ISSUE DATE / TIME: 201910090956
UNIT TYPE AND RH: 9500
Unit Type and Rh: 6200
Unit Type and Rh: 6200
Unit Type and Rh: 9500

## 2019-04-23 ENCOUNTER — Encounter (HOSPITAL_COMMUNITY): Payer: Self-pay

## 2019-04-28 LAB — OB RESULTS CONSOLE ABO/RH: RH Type: POSITIVE

## 2019-04-28 LAB — OB RESULTS CONSOLE RUBELLA ANTIBODY, IGM: Rubella: IMMUNE

## 2019-04-28 LAB — OB RESULTS CONSOLE HEPATITIS B SURFACE ANTIGEN: Hepatitis B Surface Ag: NEGATIVE

## 2019-04-28 LAB — OB RESULTS CONSOLE HIV ANTIBODY (ROUTINE TESTING): HIV: NONREACTIVE

## 2019-04-28 LAB — OB RESULTS CONSOLE RPR: RPR: NONREACTIVE

## 2019-04-28 LAB — OB RESULTS CONSOLE ANTIBODY SCREEN: Antibody Screen: NEGATIVE

## 2019-09-01 NOTE — L&D Delivery Note (Signed)
Delivery Note At 3:00 PM a viable and healthy female was delivered via  (Presentation:      ).  APGAR: 8, 9; weight pending .   Placenta status:  spontaneous, intact .  Cord:   with the following complications: [place] x one reduced.  Cord pH: na  Anesthesia: Epidural Episiotomy:na   Lacerations:  second Suture Repair: 2.0 vicryl rapide Est. Blood Loss (mL):    Mom to postpartum.  Baby to Couplet care / Skin to Skin.  Carline Dura J 11/28/2019, 3:13 PM

## 2019-11-07 LAB — OB RESULTS CONSOLE GBS: GBS: POSITIVE

## 2019-11-21 ENCOUNTER — Encounter (HOSPITAL_COMMUNITY): Payer: Self-pay | Admitting: *Deleted

## 2019-11-21 ENCOUNTER — Telehealth (HOSPITAL_COMMUNITY): Payer: Self-pay | Admitting: *Deleted

## 2019-11-21 NOTE — Telephone Encounter (Signed)
Preadmission screen  

## 2019-11-23 ENCOUNTER — Other Ambulatory Visit: Payer: Self-pay | Admitting: Obstetrics and Gynecology

## 2019-11-27 ENCOUNTER — Other Ambulatory Visit (HOSPITAL_COMMUNITY)
Admission: RE | Admit: 2019-11-27 | Discharge: 2019-11-27 | Disposition: A | Discharge status: Home / Self Care | Payer: BC Managed Care – PPO | Source: Ambulatory Visit | Attending: Obstetrics and Gynecology | Admitting: Obstetrics and Gynecology

## 2019-11-27 DIAGNOSIS — Z01812 Encounter for preprocedural laboratory examination: Secondary | ICD-10-CM | POA: Insufficient documentation

## 2019-11-27 DIAGNOSIS — Z20822 Contact with and (suspected) exposure to covid-19: Secondary | ICD-10-CM | POA: Insufficient documentation

## 2019-11-27 LAB — SARS CORONAVIRUS 2 (TAT 6-24 HRS): SARS Coronavirus 2: NEGATIVE

## 2019-11-28 ENCOUNTER — Inpatient Hospital Stay (HOSPITAL_COMMUNITY): Payer: BC Managed Care – PPO | Admitting: Anesthesiology

## 2019-11-28 ENCOUNTER — Encounter (HOSPITAL_COMMUNITY): Payer: Self-pay | Admitting: Obstetrics and Gynecology

## 2019-11-28 ENCOUNTER — Inpatient Hospital Stay (HOSPITAL_COMMUNITY): Payer: BC Managed Care – PPO

## 2019-11-28 ENCOUNTER — Inpatient Hospital Stay (HOSPITAL_COMMUNITY)
Admission: AD | Admit: 2019-11-28 | Discharge: 2019-11-29 | DRG: 807 | Disposition: A | Discharge status: Home / Self Care | Payer: BC Managed Care – PPO | Attending: Obstetrics and Gynecology | Admitting: Obstetrics and Gynecology

## 2019-11-28 ENCOUNTER — Other Ambulatory Visit: Payer: Self-pay

## 2019-11-28 DIAGNOSIS — Z20822 Contact with and (suspected) exposure to covid-19: Secondary | ICD-10-CM | POA: Diagnosis present

## 2019-11-28 DIAGNOSIS — F419 Anxiety disorder, unspecified: Secondary | ICD-10-CM | POA: Diagnosis present

## 2019-11-28 DIAGNOSIS — O99824 Streptococcus B carrier state complicating childbirth: Secondary | ICD-10-CM | POA: Diagnosis present

## 2019-11-28 DIAGNOSIS — Z3A39 39 weeks gestation of pregnancy: Secondary | ICD-10-CM | POA: Diagnosis not present

## 2019-11-28 DIAGNOSIS — O99344 Other mental disorders complicating childbirth: Secondary | ICD-10-CM | POA: Diagnosis present

## 2019-11-28 DIAGNOSIS — Z3490 Encounter for supervision of normal pregnancy, unspecified, unspecified trimester: Secondary | ICD-10-CM | POA: Diagnosis present

## 2019-11-28 LAB — CBC
HCT: 36.8 % (ref 36.0–46.0)
Hemoglobin: 12.1 g/dL (ref 12.0–15.0)
MCH: 31.2 pg (ref 26.0–34.0)
MCHC: 32.9 g/dL (ref 30.0–36.0)
MCV: 94.8 fL (ref 80.0–100.0)
Platelets: 152 10*3/uL (ref 150–400)
RBC: 3.88 MIL/uL (ref 3.87–5.11)
RDW: 12.2 % (ref 11.5–15.5)
WBC: 6.8 10*3/uL (ref 4.0–10.5)
nRBC: 0 % (ref 0.0–0.2)

## 2019-11-28 LAB — RPR: RPR Ser Ql: NONREACTIVE

## 2019-11-28 LAB — TYPE AND SCREEN
ABO/RH(D): A POS
Antibody Screen: NEGATIVE

## 2019-11-28 LAB — ABO/RH: ABO/RH(D): A POS

## 2019-11-28 MED ORDER — TETANUS-DIPHTH-ACELL PERTUSSIS 5-2.5-18.5 LF-MCG/0.5 IM SUSP: 0.5000 mL | Freq: Once | INTRAMUSCULAR | Status: DC

## 2019-11-28 MED ORDER — OXYTOCIN 40 UNITS IN NORMAL SALINE INFUSION - SIMPLE MED
1.0000 m[IU]/min | INTRAVENOUS | Status: DC
  Administered 2019-11-28: 2 m[IU]/min via INTRAVENOUS
  Filled 2019-11-28: qty 1000

## 2019-11-28 MED ORDER — SIMETHICONE 80 MG PO CHEW: 80.0000 mg | CHEWABLE_TABLET | ORAL | Status: DC | PRN

## 2019-11-28 MED ORDER — PENICILLIN G POT IN DEXTROSE 60000 UNIT/ML IV SOLN
3.0000 10*6.[IU] | INTRAVENOUS | Status: DC
  Administered 2019-11-28: 3 10*6.[IU] via INTRAVENOUS
  Filled 2019-11-28: qty 50

## 2019-11-28 MED ORDER — WITCH HAZEL-GLYCERIN EX PADS: 1.0000 "application " | MEDICATED_PAD | CUTANEOUS | Status: DC | PRN

## 2019-11-28 MED ORDER — BENZOCAINE-MENTHOL 20-0.5 % EX AERO
1.0000 "application " | INHALATION_SPRAY | CUTANEOUS | Status: DC | PRN
  Administered 2019-11-28: 1 via TOPICAL
  Filled 2019-11-28: qty 56

## 2019-11-28 MED ORDER — ONDANSETRON HCL 4 MG PO TABS: 4.0000 mg | ORAL_TABLET | ORAL | Status: DC | PRN

## 2019-11-28 MED ORDER — COCONUT OIL OIL: 1.0000 "application " | TOPICAL_OIL | Status: DC | PRN

## 2019-11-28 MED ORDER — ZOLPIDEM TARTRATE 5 MG PO TABS: 5.0000 mg | ORAL_TABLET | Freq: Every evening | ORAL | Status: DC | PRN

## 2019-11-28 MED ORDER — SERTRALINE HCL 50 MG PO TABS
50.0000 mg | ORAL_TABLET | Freq: Every day | ORAL | Status: DC
  Administered 2019-11-29: 50 mg via ORAL
  Filled 2019-11-28: qty 1

## 2019-11-28 MED ORDER — SODIUM CHLORIDE 0.9 % IV SOLN
5.0000 10*6.[IU] | Freq: Once | INTRAVENOUS | Status: AC
  Administered 2019-11-28: 5 10*6.[IU] via INTRAVENOUS
  Filled 2019-11-28: qty 5

## 2019-11-28 MED ORDER — PHENYLEPHRINE 40 MCG/ML (10ML) SYRINGE FOR IV PUSH (FOR BLOOD PRESSURE SUPPORT): 80.0000 ug | PREFILLED_SYRINGE | INTRAVENOUS | Status: DC | PRN

## 2019-11-28 MED ORDER — DIBUCAINE (PERIANAL) 1 % EX OINT: 1.0000 "application " | TOPICAL_OINTMENT | CUTANEOUS | Status: DC | PRN

## 2019-11-28 MED ORDER — ACETAMINOPHEN 325 MG PO TABS: 650.0000 mg | ORAL_TABLET | ORAL | Status: DC | PRN

## 2019-11-28 MED ORDER — OXYTOCIN 40 UNITS IN NORMAL SALINE INFUSION - SIMPLE MED: 2.5000 [IU]/h | INTRAVENOUS | Status: DC

## 2019-11-28 MED ORDER — PRENATAL MULTIVITAMIN CH
1.0000 | ORAL_TABLET | Freq: Every day | ORAL | Status: DC
  Administered 2019-11-29: 1 via ORAL
  Filled 2019-11-28: qty 1

## 2019-11-28 MED ORDER — METHYLERGONOVINE MALEATE 0.2 MG/ML IJ SOLN: 0.2000 mg | INTRAMUSCULAR | Status: DC | PRN

## 2019-11-28 MED ORDER — ONDANSETRON HCL 4 MG/2ML IJ SOLN: 4.0000 mg | INTRAMUSCULAR | Status: DC | PRN

## 2019-11-28 MED ORDER — LIDOCAINE HCL (PF) 1 % IJ SOLN
INTRAMUSCULAR | Status: DC | PRN
  Administered 2019-11-28 (×2): 4 mL via EPIDURAL

## 2019-11-28 MED ORDER — PHENYLEPHRINE 40 MCG/ML (10ML) SYRINGE FOR IV PUSH (FOR BLOOD PRESSURE SUPPORT)
80.0000 ug | PREFILLED_SYRINGE | INTRAVENOUS | Status: DC | PRN
  Filled 2019-11-28: qty 10

## 2019-11-28 MED ORDER — SENNOSIDES-DOCUSATE SODIUM 8.6-50 MG PO TABS
2.0000 | ORAL_TABLET | ORAL | Status: DC
  Administered 2019-11-28: 2 via ORAL
  Filled 2019-11-28: qty 2

## 2019-11-28 MED ORDER — OXYCODONE-ACETAMINOPHEN 5-325 MG PO TABS: 2.0000 | ORAL_TABLET | ORAL | Status: DC | PRN

## 2019-11-28 MED ORDER — LACTATED RINGERS IV SOLN: INTRAVENOUS | Status: DC

## 2019-11-28 MED ORDER — EPHEDRINE 5 MG/ML INJ: 10.0000 mg | INTRAVENOUS | Status: DC | PRN

## 2019-11-28 MED ORDER — DIPHENHYDRAMINE HCL 25 MG PO CAPS: 25.0000 mg | ORAL_CAPSULE | Freq: Four times a day (QID) | ORAL | Status: DC | PRN

## 2019-11-28 MED ORDER — FENTANYL-BUPIVACAINE-NACL 0.5-0.125-0.9 MG/250ML-% EP SOLN
12.0000 mL/h | EPIDURAL | Status: DC | PRN
  Filled 2019-11-28: qty 250

## 2019-11-28 MED ORDER — OXYTOCIN BOLUS FROM INFUSION: 500.0000 mL | Freq: Once | INTRAVENOUS | Status: DC

## 2019-11-28 MED ORDER — LACTATED RINGERS IV SOLN: 500.0000 mL | INTRAVENOUS | Status: DC | PRN

## 2019-11-28 MED ORDER — LACTATED RINGERS IV SOLN: 500.0000 mL | Freq: Once | INTRAVENOUS | Status: DC

## 2019-11-28 MED ORDER — TERBUTALINE SULFATE 1 MG/ML IJ SOLN: 0.2500 mg | Freq: Once | INTRAMUSCULAR | Status: DC | PRN

## 2019-11-28 MED ORDER — ONDANSETRON HCL 4 MG/2ML IJ SOLN: 4.0000 mg | Freq: Four times a day (QID) | INTRAMUSCULAR | Status: DC | PRN

## 2019-11-28 MED ORDER — OXYCODONE-ACETAMINOPHEN 5-325 MG PO TABS: 1.0000 | ORAL_TABLET | ORAL | Status: DC | PRN

## 2019-11-28 MED ORDER — SODIUM CHLORIDE (PF) 0.9 % IJ SOLN
INTRAMUSCULAR | Status: DC | PRN
  Administered 2019-11-28: 12 mL/h via EPIDURAL

## 2019-11-28 MED ORDER — LIDOCAINE HCL (PF) 1 % IJ SOLN: 30.0000 mL | INTRAMUSCULAR | Status: DC | PRN

## 2019-11-28 MED ORDER — SOD CITRATE-CITRIC ACID 500-334 MG/5ML PO SOLN: 30.0000 mL | ORAL | Status: DC | PRN

## 2019-11-28 MED ORDER — IBUPROFEN 600 MG PO TABS
600.0000 mg | ORAL_TABLET | Freq: Four times a day (QID) | ORAL | Status: DC
  Administered 2019-11-28 – 2019-11-29 (×4): 600 mg via ORAL
  Filled 2019-11-28 (×4): qty 1

## 2019-11-28 MED ORDER — METHYLERGONOVINE MALEATE 0.2 MG PO TABS: 0.2000 mg | ORAL_TABLET | ORAL | Status: DC | PRN

## 2019-11-28 MED ORDER — DIPHENHYDRAMINE HCL 50 MG/ML IJ SOLN: 12.5000 mg | INTRAMUSCULAR | Status: DC | PRN

## 2019-11-28 NOTE — Progress Notes (Signed)
I was asked by Dr. Billy Coast to assess for AROM.  S:  More comfortable with epidural now. Reviewed AROM with patient and she agrees.   O: Pitocin at 6 milliunits   VS: Blood pressure 112/61, pulse 64, temperature 99 F (37.2 C), temperature source Oral, resp. rate 16, height 5\' 4"  (1.626 m), weight 77 kg, unknown if currently breastfeeding.        FHR : baseline 135 bpm / variability moderate / accelerations +15x15 / no decelerations        Toco: contractions every 1-2 minutes / mild-moderate         Cervix : 2.5cm initially/60%/-2 AROM for small bloody fluid Immediately after AROM, pt. Had a prolonged deceleration, cervix reassess and no cord palpated; pt. Turned to left lateral side with good recovery   Dilation: 3 Effacement (%): 70 Cervical Position: Middle Station: -2 Presentation: Vertex Exam by:: Elmar Antigua, CNM  A/P:    Latent labor     FHR category 2    GBS positive - received 1st dose PCN at 0920   P: Continue Pitocin augmentation PRN      Dr. [phone] updated with FHR/status and he will assume care    Billy Coast, MSN, CNM [facility]

## 2019-11-28 NOTE — Anesthesia Preprocedure Evaluation (Signed)
Anesthesia Evaluation  Patient identified by MRN, date of birth, ID band Patient awake    Reviewed: Allergy & Precautions, Patient's Chart, lab work & pertinent test results  History of Anesthesia Complications Negative for: history of anesthetic complications  Airway Mallampati: II  TM Distance: >3 FB Neck ROM: Full    Dental no notable dental hx.    Pulmonary neg pulmonary ROS,    Pulmonary exam normal        Cardiovascular negative cardio ROS Normal cardiovascular exam     Neuro/Psych Depression negative neurological ROS     GI/Hepatic negative GI ROS, Neg liver ROS,   Endo/Other  negative endocrine ROS  Renal/GU negative Renal ROS  negative genitourinary   Musculoskeletal negative musculoskeletal ROS (+)   Abdominal   Peds  Hematology negative hematology ROS (+)   Anesthesia Other Findings Day of surgery medications reviewed with patient.  Reproductive/Obstetrics (+) Pregnancy                             Anesthesia Physical Anesthesia Plan  ASA: II  Anesthesia Plan: Epidural   Post-op Pain Management:    Induction:   PONV Risk Score and Plan: Treatment may vary due to age or medical condition  Airway Management Planned: Natural Airway  Additional Equipment:   Intra-op Plan:   Post-operative Plan:   Informed Consent: I have reviewed the patients History and Physical, chart, labs and discussed the procedure including the risks, benefits and alternatives for the proposed anesthesia with the patient or authorized representative who has indicated his/her understanding and acceptance.       Plan Discussed with:   Anesthesia Plan Comments:         Anesthesia Quick Evaluation

## 2019-11-28 NOTE — Anesthesia Procedure Notes (Signed)
Epidural Patient location during procedure: OB Start time: 11/28/2019 10:42 AM End time: 11/28/2019 10:45 AM  Staffing Anesthesiologist: Kaylyn Layer, MD Performed: anesthesiologist   Preanesthetic Checklist Completed: patient identified, IV checked, risks and benefits discussed, monitors and equipment checked, pre-op evaluation and timeout performed  Epidural Patient position: sitting Prep: DuraPrep and site prepped and draped Patient monitoring: continuous pulse ox, blood pressure and heart rate Approach: midline Location: L3-L4 Injection technique: LOR air  Needle:  Needle type: Tuohy  Needle gauge: 17 G Needle length: 9 cm Needle insertion depth: 6 cm Catheter type: closed end flexible Catheter size: 19 Gauge Catheter at skin depth: 11 cm Test dose: negative and Other (1% lidocaine)  Assessment Events: blood not aspirated, injection not painful, no injection resistance, no paresthesia and negative IV test  Additional Notes Patient identified. Risks, benefits, and alternatives discussed with patient including but not limited to bleeding, infection, nerve damage, paralysis, failed block, incomplete pain control, headache, blood pressure changes, nausea, vomiting, reactions to medication, itching, and postpartum back pain. Confirmed with bedside nurse the patient's most recent platelet count. Confirmed with patient that they are not currently taking any anticoagulation, have any bleeding history, or any family history of bleeding disorders. Patient expressed understanding and wished to proceed. All questions were answered. Sterile technique was used throughout the entire procedure. Please see nursing notes for vital signs.   Crisp LOR on first pass. Test dose was given through epidural catheter and negative prior to continuing to dose epidural or start infusion. Warning signs of high block given to the patient including shortness of breath, tingling/numbness in hands, complete  motor block, or any concerning symptoms with instructions to call for help. Patient was given instructions on fall risk and not to get out of bed. All questions and concerns addressed with instructions to call with any issues or inadequate analgesia.  Reason for block:procedure for pain

## 2019-11-28 NOTE — H&P (Signed)
[name] is a 34 y.o. female presenting for IOL due to inc anxiety and favorable cervix. OB History    Gravida  3   Para  1   Term  1   Preterm      AB  1   Living  1     SAB      TAB      Ectopic  1   Multiple      Live Births  1          Past Medical History:  Diagnosis Date  . Abnormal Pap smear   . Depression   . Vaginal Pap smear, abnormal    Past Surgical History:  Procedure Laterality Date  . LAPAROSCOPIC UNILATERAL SALPINGECTOMY Right 06/05/2018   Procedure: LAPAROSCOPIC UNILATERAL SALPINGECTOMY;  Surgeon: Noland Fordyce, MD;  Location: [facility];  Service: Gynecology;  Laterality: Right;  . LAPAROSCOPY N/A 06/05/2018   Procedure: LAPAROSCOPY OPERATIVE WITH REMOVAL OF ECTOPIC PREGNANCY;  Surgeon: Noland Fordyce, MD;  Location: [facility];  Service: Gynecology;  Laterality: N/A;  . NO PAST SURGERIES     Family History: family history includes Cancer in her maternal grandfather, paternal grandfather, and paternal grandmother; Heart disease in her maternal grandmother; Hyperlipidemia in her mother; Hypertension in her father and mother; Miscarriages / [place] in her mother; Multiple sclerosis in her mother. Social History:  reports that she has never smoked. She has never used smokeless tobacco. She reports that she does not drink alcohol or use drugs.     Maternal Diabetes: No Genetic Screening: Normal Maternal Ultrasounds/Referrals: Normal Fetal Ultrasounds or other Referrals:  None Maternal Substance Abuse:  No Significant Maternal Medications:  Meds include: Zoloft Significant Maternal Lab Results:  gbs Other Comments:  None  Review of Systems  Constitutional: Negative.   All other systems reviewed and are negative.  Maternal Medical History:  Fetal activity: Perceived fetal activity is normal.   Last perceived fetal movement was within the past hour.    Prenatal complications: no prenatal complications Prenatal Complications -  Diabetes: none.      unknown if currently breastfeeding. Maternal Exam:  Uterine Assessment: Contraction strength is mild.  Contraction frequency is irregular.   Abdomen: Patient reports no abdominal tenderness. Surgical scars: laparoscopic scar.   Fetal presentation: vertex  Introitus: Normal vulva. Normal vagina.  [place] test: not done.  Nitrazine test: not done. Amniotic fluid character: not assessed.  Pelvis: adequate for delivery.   Cervix: Cervix evaluated by digital exam.     Physical Exam  Nursing note and vitals reviewed. Constitutional: She is oriented to person, place, and time. She appears well-developed and well-nourished.  HENT:  Head: Normocephalic and atraumatic.  Cardiovascular: Normal rate and regular rhythm.  Respiratory: Effort normal and breath sounds normal.  GI: Soft. Bowel sounds are normal.  Genitourinary:    Vulva, vagina and uterus normal.   Musculoskeletal:        General: Normal range of motion.     Cervical back: Normal range of motion and neck supple.  Neurological: She is alert and oriented to person, place, and time. She has normal reflexes.  Skin: Skin is warm and dry.  Psychiatric: She has a normal mood and affect.    Prenatal labs: ABO, Rh: A/Positive/-- (08/28 0000) Antibody: Negative (08/28 0000) Rubella: Immune (08/28 0000) RPR: Nonreactive (08/28 0000)  HBsAg: Negative (08/28 0000)  HIV: Non-reactive (08/28 0000)  GBS: Positive/-- (03/09 0000)   Assessment/Plan: 39wk iup Maternal anxiety GBS IOL, IV  abx   Konni Kesinger J 11/28/2019, 8:16 AM

## 2019-11-29 DIAGNOSIS — F419 Anxiety disorder, unspecified: Secondary | ICD-10-CM | POA: Diagnosis present

## 2019-11-29 LAB — CBC
HCT: 30.1 % — ABNORMAL LOW (ref 36.0–46.0)
Hemoglobin: 10.3 g/dL — ABNORMAL LOW (ref 12.0–15.0)
MCH: 32.4 pg (ref 26.0–34.0)
MCHC: 34.2 g/dL (ref 30.0–36.0)
MCV: 94.7 fL (ref 80.0–100.0)
Platelets: 121 10*3/uL — ABNORMAL LOW (ref 150–400)
RBC: 3.18 MIL/uL — ABNORMAL LOW (ref 3.87–5.11)
RDW: 12.5 % (ref 11.5–15.5)
WBC: 8 10*3/uL (ref 4.0–10.5)
nRBC: 0 % (ref 0.0–0.2)

## 2019-11-29 MED ORDER — ACETAMINOPHEN 500 MG PO TABS: 1000.0000 mg | ORAL_TABLET | Freq: Four times a day (QID) | ORAL | 2 refills | Status: AC | PRN

## 2019-11-29 MED ORDER — IBUPROFEN 600 MG PO TABS: 600.0000 mg | ORAL_TABLET | Freq: Four times a day (QID) | ORAL | 0 refills | Status: AC

## 2019-11-29 MED ORDER — COCONUT OIL OIL: 1.0000 "application " | TOPICAL_OIL | 0 refills | Status: AC | PRN

## 2019-11-29 MED ORDER — BENZOCAINE-MENTHOL 20-0.5 % EX AERO: 1.0000 "application " | INHALATION_SPRAY | CUTANEOUS | Status: AC | PRN

## 2019-11-29 NOTE — Lactation Note (Addendum)
This note was copied from a baby's chart. Lactation Consultation Note:   Mother is a P2 , infant is 24 hours   Mother reports that she breastfed her first child for 1 yr. .   Mother was given [facility] brochure and basic teaching done.  Mother reports that infant is feeding well.   Reviewed hand expression with mother. Observed large drops of colostrum. Mother was given a harmony hand pump with instructions. Mothers nipples are erect with compressible breast tissue. No observed trama of mothers nipples.   Assist mother with better positioning. mother Mother was observed with infant latched on at the left breast. Observed infant suckling with audible swallows. Infant sustained latch for 20 mins.    Mother to continue to cue base feed infant and feed at least 8-12 times or more in 24 hours and advised to allow for cluster feeding infant as needed.  Mother to continue to due STS. Mother is aware of available LC services at [facility], B[facility]S, OP Dept, and phone # for questions or concerns about breastfeeding.  Mother receptive to all teaching and plan of care.    Patient Name: [name] [id number] Date: 11/29/2019 Reason for consult: Initial assessment   Maternal Data    Feeding Feeding Type: Breast Fed  LATCH Score Latch: Grasps breast easily, tongue down, lips flanged, rhythmical sucking.  Audible Swallowing: A few with stimulation  Type of Nipple: Everted at rest and after stimulation  Comfort (Breast/Nipple): Soft / non-tender  Hold (Positioning): No assistance needed to correctly position infant at breast.  LATCH Score: 9  Interventions Interventions: Breast feeding basics reviewed;Assisted with latch;Hand express;Breast compression;Support pillows;Position options;Expressed milk;Hand pump  Lactation Tools Discussed/Used     Consult Status Consult Status: Follow-up Date: 11/30/19 Follow-up type: In-patient    Stevan Born [facility] 11/29/2019, 4:06 PM

## 2019-11-29 NOTE — Progress Notes (Signed)
[name] was referred for history of depression/anxiety. * Referral screened out by Clinical Social Worker because none of the following criteria appear to apply: ~ History of anxiety/depression during this pregnancy, or of post-partum depression following prior delivery. ~ Diagnosis of anxiety and/or depression within last 3 years OR * [name]'s symptoms currently being treated with medication and/or therapy. Per further chart review, [name] on Zoloft for anxiety/depression.    CSW aware that [name] scored 1 on [place] with no concerns to CSW at this time.     [name], MSW, LCSW [facility] [phone]

## 2019-11-29 NOTE — Discharge Summary (Signed)
OB Discharge Summary  Patient Name: [name] DOB: [birth year] MRN: [id number]  Date of admission: 11/28/2019 Delivering provider: Olivia Mackie   Date of discharge: 11/29/2019  Admitting diagnosis: Encounter for planned induction of labor [Z34.90] Intrauterine pregnancy: [redacted]w[redacted]d     Secondary diagnosis:Principal Problem:   Postpartum care following vaginal delivery (3/30) Active Problems:   Encounter for planned induction of labor   SVD (spontaneous vaginal delivery)   Second degree perineal laceration   Anxiety  Additional problems:none     Discharge diagnosis:  Patient Active Problem List   Diagnosis Date Noted  . Anxiety 11/29/2019  . Encounter for planned induction of labor 11/28/2019  . Postpartum care following vaginal delivery (3/30) 11/28/2019  . SVD (spontaneous vaginal delivery) 11/28/2019  . Second degree perineal laceration 11/28/2019  . Ectopic pregnancy 06/05/2018                                                                Post partum procedures:none  Augmentation: AROM and Pitocin Pain control: Epidural  Laceration:2nd degree  Episiotomy:None  Complications: None   Hospital course:  Induction of Labor With Vaginal Delivery   34 y.o. yo [id number] at [redacted]w[redacted]d was admitted to the hospital 11/28/2019 for induction of labor.  Indication for induction: Favorable cervix at term.  Patient had an uncomplicated labor course as follows: Membrane Rupture Time/Date: 11:40 AM ,11/28/2019   Intrapartum Procedures: Episiotomy: None [1]                                         Lacerations:  2nd degree [3]  Patient had delivery of a Viable infant.  Information for the patient's newborn:  Caera, Enwright [[id number]]  Delivery Method: Vaginal, Spontaneous(Filed from Delivery Summary)    11/28/2019  Details of delivery can be found in separate delivery note.  Patient had a routine postpartum course. Patient is discharged home 11/29/19.  Physical exam  Vitals:   11/28/19 2324 11/29/19 0300 11/29/19 0310 11/29/19 0700  BP: 111/60  114/67 100/70  Pulse: 63  66 63  Resp: 16 16  16   Temp: 98.6 F (37 C) 99.1 F (37.3 C)  98.2 F (36.8 C)  TempSrc: Oral Oral    SpO2: 100% 99%  99%  Weight:      Height:       General: alert, cooperative and no distress Lochia: appropriate Uterine Fundus: firm Incision: Healing well with no significant drainage DVT Evaluation: No cords or calf tenderness. No significant calf/ankle edema. Labs: Lab Results  Component Value Date   WBC 8.0 11/29/2019   HGB 10.3 (L) 11/29/2019   HCT 30.1 (L) 11/29/2019   MCV 94.7 11/29/2019   PLT 121 (L) 11/29/2019   No flowsheet data found. Edinburgh Postnatal Depression Scale Screening Tool 11/28/2019  I have been able to laugh and see the funny side of things. 0  I have looked forward with enjoyment to things. 0  I have blamed myself unnecessarily when things went wrong. 0  I have been anxious or worried for no good reason. 1  I have felt scared or panicky for no good reason. 0  Things have been getting on top of  me. 0  I have been so unhappy that I have had difficulty sleeping. 0  I have felt sad or miserable. 0  I have been so unhappy that I have been crying. 0  The thought of harming myself has occurred to me. 0  Edinburgh Postnatal Depression Scale Total 1   Vaccines: TDaP UTD         Flu    UTD  Discharge instruction: per After Visit Summary and "Baby and Me Booklet".  After Visit Meds:  Allergies as of 11/29/2019   No Known Allergies     Medication List    STOP taking these medications   ondansetron 4 MG tablet Commonly known as: ZOFRAN     TAKE these medications   acetaminophen 500 MG tablet Commonly known as: TYLENOL Take 2 tablets (1,000 mg total) by mouth every 6 (six) hours as needed. What changed:   medication strength  how much to take  reasons to take this   benzocaine-Menthol 20-0.5 % Aero Commonly known as: DERMOPLAST Apply 1  application topically as needed for irritation (perineal discomfort).   coconut oil Oil Apply 1 application topically as needed.   ibuprofen 600 MG tablet Commonly known as: ADVIL Take 1 tablet (600 mg total) by mouth every 6 (six) hours. What changed:   when to take this  reasons to take this   prenatal multivitamin Tabs tablet Take 1 tablet by mouth daily at 12 noon.   PROBIOTIC DAILY PO Take 1 capsule by mouth daily.   sertraline 50 MG tablet Commonly known as: ZOLOFT Take 50 mg by mouth daily.            Discharge Care Instructions  (From admission, onward)         Start     Ordered   11/29/19 0000  Discharge wound care:    Comments: Sitz baths 2 times /day with warm water x 1 week. May add herbals: 1 ounce dried comfrey leaf* 1 ounce calendula flowers 1 ounce lavender flowers 1/2 ounce dried uva ursi leaves 1/2 ounce witch hazel blossoms (if you can find them) 1/2 ounce dried sage leaf 1/2 cup sea salt Directions: Bring 2 quarts of water to a boil. Turn off heat, and place 1 ounce (approximately 1 large handful) of the above mixed herbs (not the salt) into the pot. Steep, covered, for 30 minutes.  Strain the liquid well with a fine mesh strainer, and discard the herb material. Add 2 quarts of liquid to the tub, along with the 1/2 cup of salt. This medicinal liquid can also be made into compresses and peri-rinses.   11/29/19 1053          Diet: routine diet  Activity: Advance as tolerated. Pelvic rest for 6 weeks.   Postpartum contraception: Not Discussed  Newborn Data: Live born female  Birth Weight: 7 lb 6.2 oz (3351 g) APGAR: 9, 9  Newborn Delivery   Birth date/time: 11/28/2019 15:00:00 Delivery type: Vaginal, Spontaneous      named Grayce Sessions Baby Feeding: Breast Disposition:home with mother   Delivery Report:  Review the Delivery Report for details.    Follow up: Follow-up Information    Brien Few, MD. Schedule an appointment as  soon as possible for a visit in 6 week(s).   Specialty: Obstetrics and Gynecology Contact information: [place] [id number] [phone]             Signed: Otilio Carpen, MSN 11/29/2019, 10:54 AM

## 2019-11-29 NOTE — Anesthesia Postprocedure Evaluation (Signed)
Anesthesia Post Note  Patient: [name]  Procedure(s) Performed: AN AD HOC LABOR EPIDURAL     Patient location during evaluation: Mother Baby Anesthesia Type: Epidural Level of consciousness: awake Pain management: satisfactory to patient Vital Signs Assessment: post-procedure vital signs reviewed and stable Respiratory status: spontaneous breathing Cardiovascular status: stable Anesthetic complications: no    Last Vitals:  Vitals:   11/29/19 0310 11/29/19 0700  BP: 114/67 100/70  Pulse: 66 63  Resp:  16  Temp:  36.8 C  SpO2:  99%    Last Pain:  Vitals:   11/29/19 0702  TempSrc:   PainSc: 0-No pain   Pain Goal:                   Cephus Shelling

## 2020-03-06 IMAGING — US US OB < 14 WEEKS - US OB TV
2 series · 15 of 28 positions shown · non-contrast
Comparison: None.

CLINICAL DATA: 32-year-old female with vaginal bleeding in the 1st
trimester of pregnancy. Orthostatic. Quantitative beta HCG [DATE].

EXAM:
OBSTETRIC <14 WK US AND TRANSVAGINAL OB US
TECHNIQUE: Both transabdominal and transvaginal ultrasound examinations were
performed for complete evaluation of the gestation as well as the
maternal uterus, adnexal regions, and pelvic cul-de-sac.
Transvaginal technique was performed to assess early pregnancy.

[Series 1: us ob < 14 weeks - us ob tv · 4 of 26 slices shown (1 of 2)]
[im 1/26]
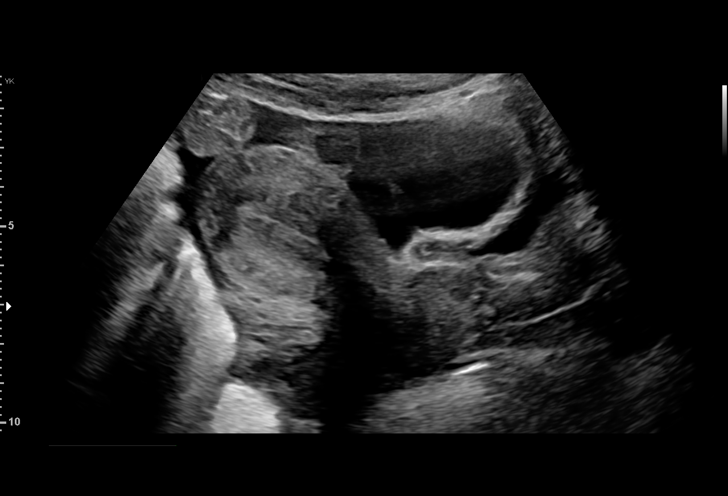
[im 8/26]
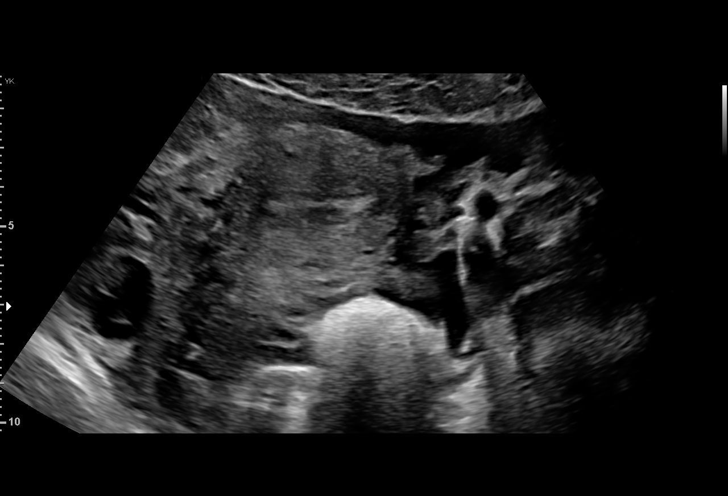
[im 15/26]
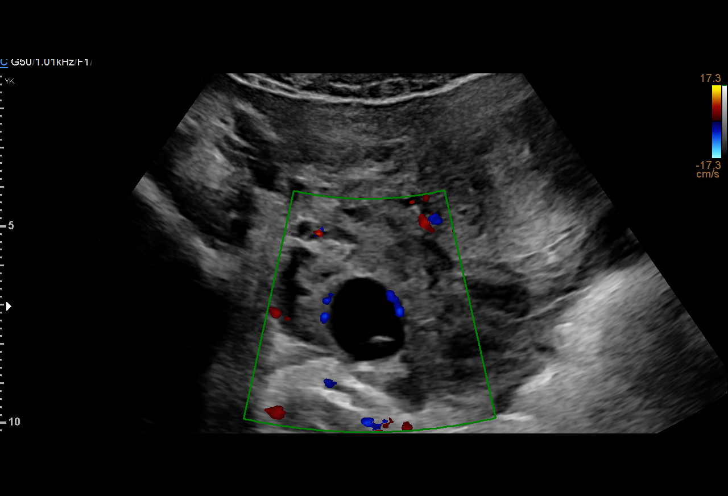
[im 22/26]
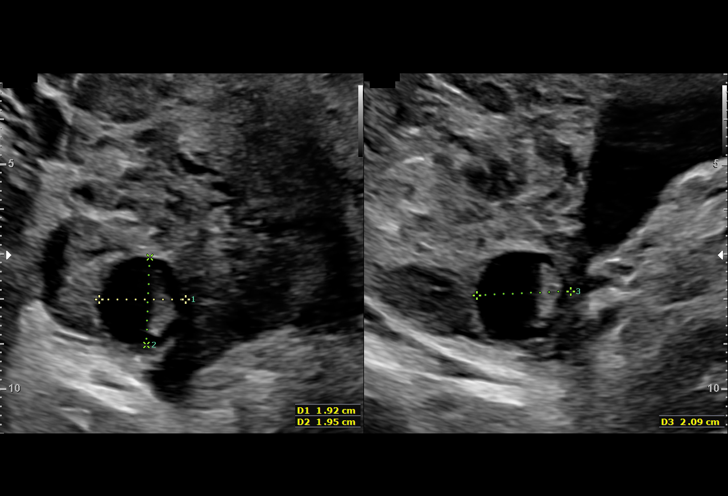

[Series 2: us ob < 14 weeks - us ob tv · 60 acquisitions, 11 frames shown (2 of 2)]
[im 1/60]
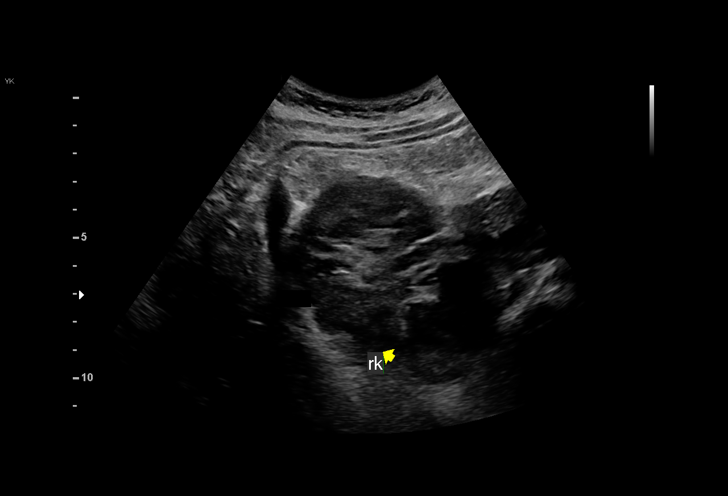
[im 7/60]
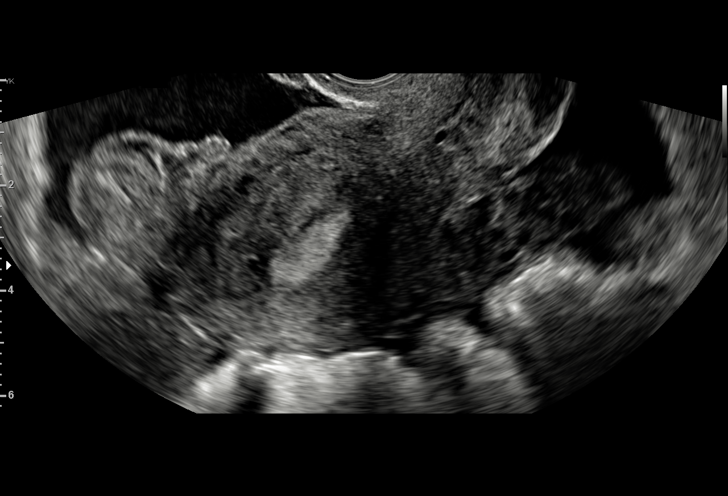
[im 13/60]
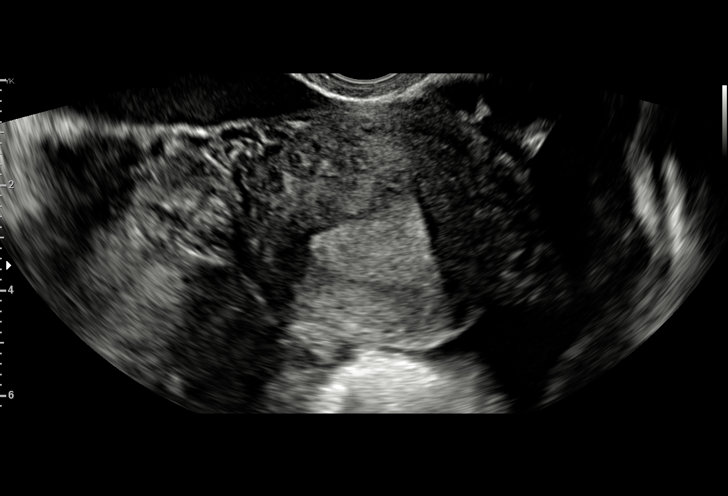
[im 19/60]
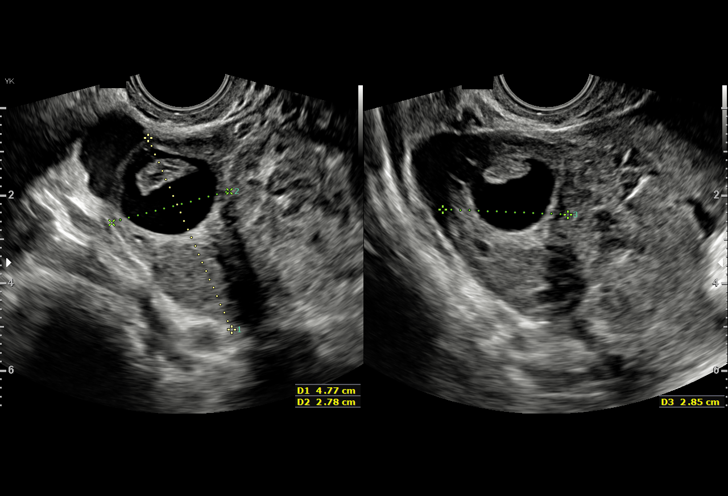
[im 22/60]
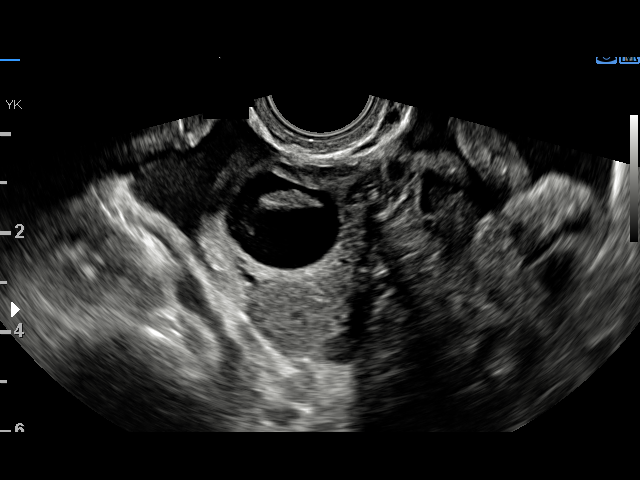
[im 28/60]
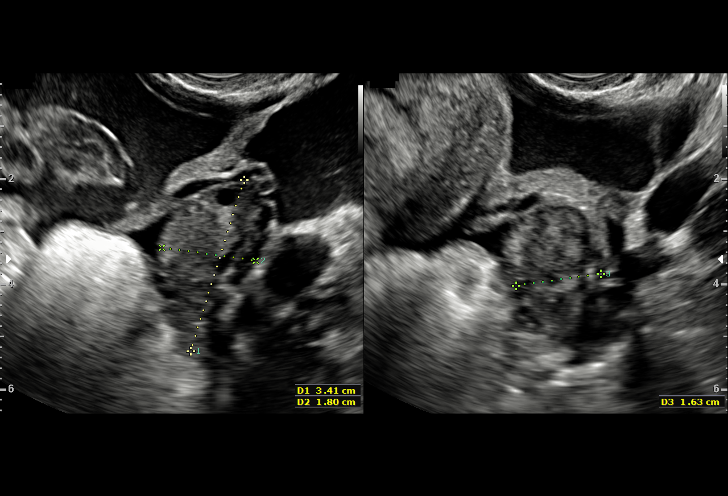
[im 35/60]
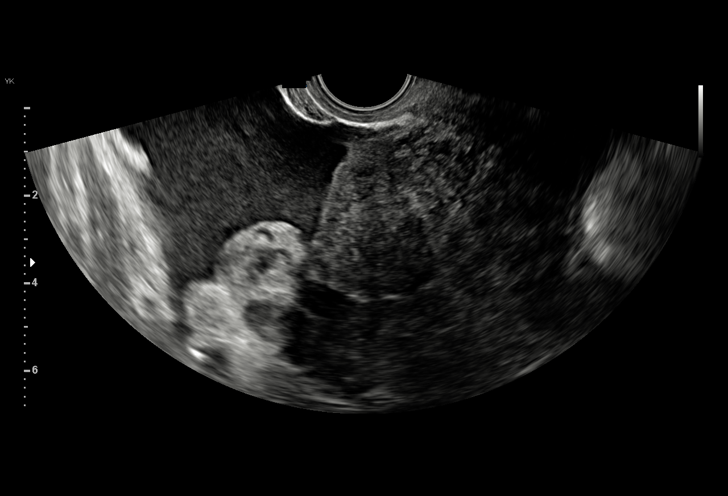
[im 41/60]
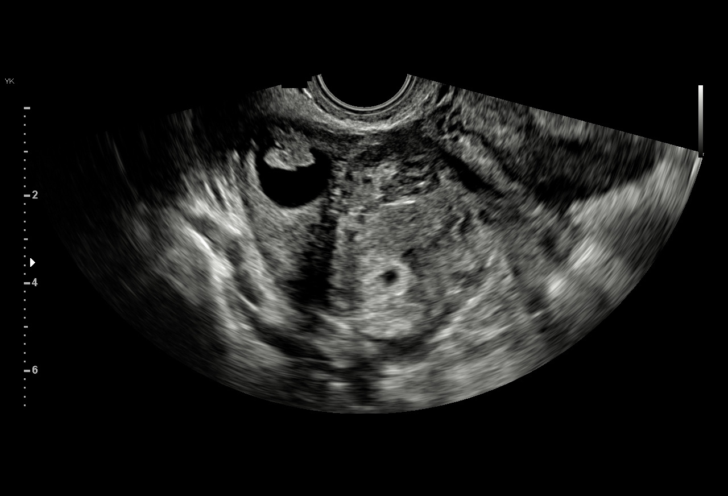
[im 47/60]
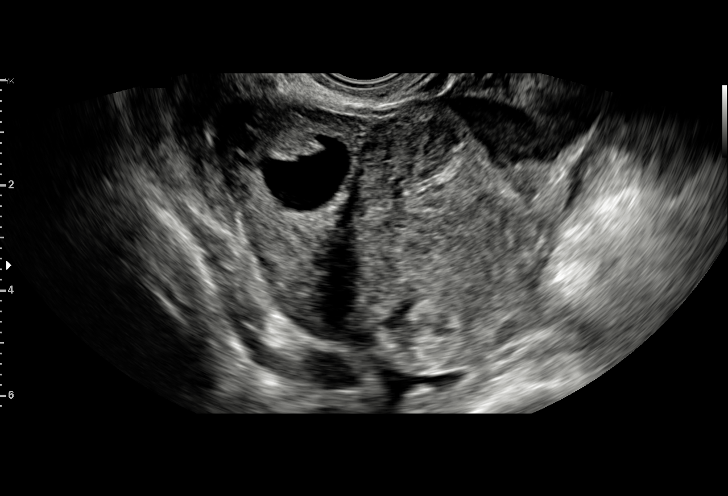
[im 53/60]
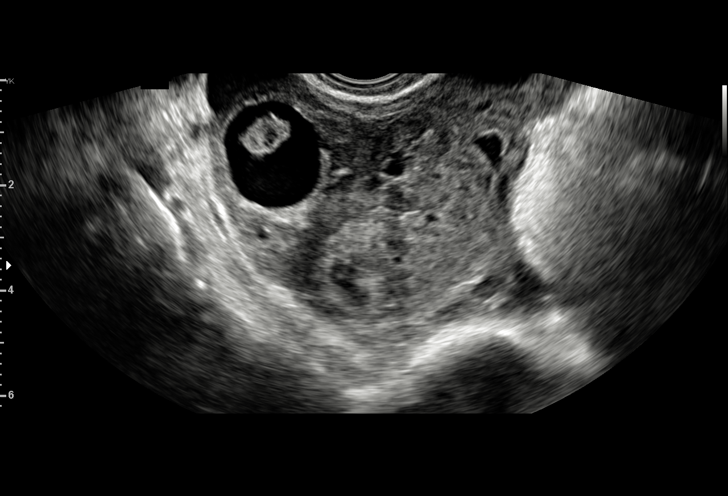
[im 60/60]
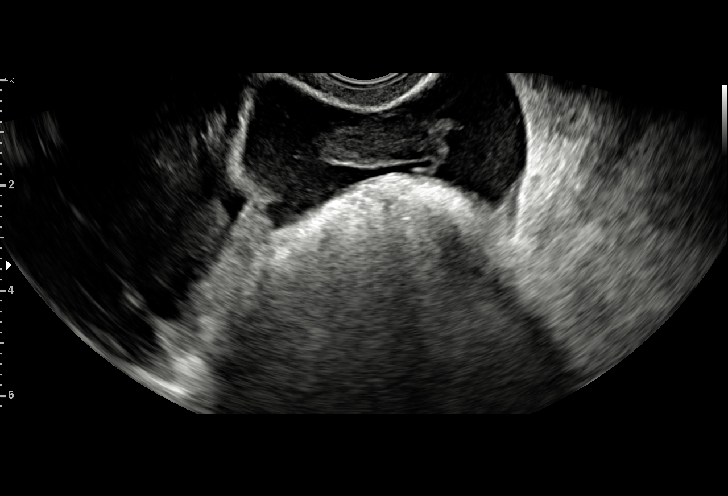

[15 of 28 positions shown; findings below may reference images not displayed]

FINDINGS: Intrauterine gestational sac: None, the endometrium appears bland.

Yolk sac:  None.

Embryo:  None.

Cardiac Activity: Not applicable

Subchorionic hemorrhage:  Not applicable

Maternal uterus/adnexae: The left ovary appears normal measuring
x 1.5 x 1.6 centimeters.

The right ovary is larger. The right ovary contains both a
centimeter partially cystic partially complex lesion with peripheral
vascularity (image 21), and a smaller oval 7 millimeter cystic
structure (image 25) situated in a much larger area of complex
echogenicity (up to 6 centimeters, image 26).

Furthermore, there is a large volume of free fluid in the pelvis and
lower quadrants with low level internal echoes (image 32).
IMPRESSION: Constellation of findings highly suspicious for Right Adnexal
Ectopic Pregnancy with Rupture and Hemoperitoneum.

-no IUP, bland endometrium.

-large volume free fluid with internal echo suspicious for blood.

-complex right ovarian area up to 6 centimeters with a small 7 mm
oval structure suspicious for gestational sac.

Critical Value/emergent results were called by telephone at the time
of interpretation on 06/05/2018 at [DATE] to Rooba Odah
, who verbally acknowledged these results.

## 2020-05-03 ENCOUNTER — Other Ambulatory Visit: Payer: Self-pay
